# Patient Record
Sex: Female | Born: 1955 | Race: Asian | Hispanic: No | Marital: Married | Smoking: Never smoker
Health system: Southern US, Community
[De-identification: ages and names within clinical notes are randomized; demographics above are authoritative.]

## PROBLEM LIST (undated history)

## (undated) DIAGNOSIS — Z9889 Other specified postprocedural states: Secondary | ICD-10-CM

## (undated) DIAGNOSIS — R112 Nausea with vomiting, unspecified: Secondary | ICD-10-CM

## (undated) HISTORY — PX: BACK SURGERY: SHX140

---

## 2005-03-05 ENCOUNTER — Encounter: Admission: RE | Admit: 2005-03-05 | Discharge: 2005-03-05 | Payer: Self-pay | Admitting: Family Medicine

## 2005-04-08 ENCOUNTER — Ambulatory Visit (HOSPITAL_COMMUNITY): Admission: RE | Admit: 2005-04-08 | Discharge: 2005-04-09 | Payer: Self-pay | Admitting: Neurosurgery

## 2005-06-14 ENCOUNTER — Encounter: Admission: RE | Admit: 2005-06-14 | Discharge: 2005-06-14 | Payer: Self-pay | Admitting: Neurosurgery

## 2005-12-17 ENCOUNTER — Encounter (INDEPENDENT_AMBULATORY_CARE_PROVIDER_SITE_OTHER): Payer: Self-pay | Admitting: Specialist

## 2005-12-17 ENCOUNTER — Ambulatory Visit (HOSPITAL_COMMUNITY): Admission: RE | Admit: 2005-12-17 | Discharge: 2005-12-17 | Payer: Self-pay | Admitting: Obstetrics and Gynecology

## 2010-04-14 ENCOUNTER — Encounter
Admission: RE | Admit: 2010-04-14 | Discharge: 2010-04-14 | Payer: Self-pay | Source: Home / Self Care | Attending: Family Medicine | Admitting: Family Medicine

## 2010-09-11 NOTE — Op Note (Signed)
NAME:  Samantha Maddox, Samantha Maddox                     ACCOUNT NO.:  [id number]   MEDICAL RECORD NO.:  [id number]          PATIENT TYPE:  INP   LOCATION:  2899                         FACILITY:  [facility]   PHYSICIAN:  Reinaldo Meeker, M.D. DATE OF BIRTH:  1956/03/20   DATE OF PROCEDURE:  04/08/2005  DATE OF DISCHARGE:                                 OPERATIVE REPORT   PREOPERATIVE DIAGNOSIS:  Spondylolisthesis with stenosis, L5-S1.   POSTOPERATIVE DIAGNOSIS:  Spondylolisthesis with stenosis, L5-S1.   PROCEDURE:  Bilateral L5-S1 decompressive laminectomy followed by bilateral  microdiskectomy followed by posterior lumbar interbody fusion followed by  transfacet screw fixation.   SECONDARY PROCEDURE:  Microdissection, L5-S1 nerve roots and L5-S1 disks and  intraoperative EMG monitoring.   SURGEON:  Reinaldo Meeker, M.D.   ASSISTANT:  Marikay Alar, M.D.   DESCRIPTION OF PROCEDURE:  After being placed in the prone position, the  patient's back was prepped and draped in the usual sterile fashion.  A  localizing x-ray was taken prior to incision, and we identified the  appropriate level.   A midline incision was made above the spinous processes of L5 and S1.  Using  __________ cutting current, the incision was carried down to the spinous  processes.  Subperiosteal dissection was then carried out bilaterally on the  spinous processes, lamina, and facet joint.  A self-retaining retractor was  placed for exposure.  X-rays showed approach at the appropriate level.  Using the Stille rongeur, the spinous process and intraspinous ligament were  removed.  Starting on the patient's left side, laminotomy was performed by  removing the inferior one-half of the L5 lamina, the medial one-third of the  facet joint, and the superior one-half of the S1 lamina.  Residual bone and  ligamentum flavum were removed in a piecemeal fashion.  Similar  decompression was carried out on the patient's right side, once again  removing the medial one-third of the facet joint, the inferior one-half of  the L5 lamina, and the superior one-half of the S1 lamina.  Once again,  residual bone was removed to be used later in the case.  The ligamentum  flavum was removed in a piecemeal fashion.  Residual midline structures were  then removed to complete the bilateral decompressive laminectomy.  The S1  nerve roots were followed out to the foramen bilaterally.  On the patient's  left, symptomatic side, dissection was carried up to identify the L5 nerve  root, and more bone was needed was removed than necessary for simple  interbody fusion.  At this time, bilateral microdiskectomy was carried out.  Microdissection technique was used to identify the lateral aspect of the  thecal sac and S1 nerve root.  Coagulation was carried out on the floor of  the canal to identify the disk.  Disk annulus was coagulated and incised and  disk thoroughly cleaned out with pituitary rongeurs and curettes.  Similar  microdiskectomy was carried out on the patient's opposite side.   The interspace was then prepared for posterior lumbar interbody fusion.  A  10-mm distractor  was placed and found to be a good fit.  On the opposite  side, scraping followed by box cutting was carried out.  At this time, a 10  x 9 x 20-mm plug was then packed into good position and confirmed under  fluoroscopy.  The distractor was then removed.  A similar procedure was  carried out on the opposite side.  Prior to placing the second bony spacer  and autologous bone graft, __________ were placed in the midline to help  with interbody fusion.  At this time, both spacers were found to be in good  position.   Transfacet screw fixation was then carried out.  Starting on the patient's  left side, a drill over a guidewire was passed down through the inferior  facet of L5 and entered the pedicle of S1.  This was followed under AP and  lateral fluoroscopy ____________ of  the medial pedicle.  Intraoperative EMG  monitoring also showed no evidence of breach of the pedicle.  A 30-mm screw  was then placed after tapping, and the screws were noted to be in good  position.  A similar procedure was then carried out on the patient's right  side.  Once again, a drill over a guidewire was passed down through the  inferior facet of L5 and into the pedicle of S1.  Once again, intact pedicle  was confirmed with direct palpation in the AP and lateral fluoroscopy and  intraoperative EMG monitoring, all of which showed an intact medial pedicle.  Once again, tapping was carried out followed by placing a 30-mm transfacet  screw.  Final fluoroscopy at this time showed excellent placement of the  screws in the AP and lateral direction as well as the bony spacers.  Large  amounts of irrigation were carried out at this time.  Any bleeding was  controlled with bipolar coagulation and Gelfoam.  The wound was then closed  in multiple layers with Vicryl in the muscle fascia, subcutaneous tissues,  and staples on the skin.  A sterile dressing was then applied.   The patient was extubated and taken to the recovery room in stable  condition.           ______________________________  Reinaldo Meeker, M.D.     ROK/MEDQ  D:  04/08/2005  T:  04/11/2005  Job:  [id number]

## 2010-09-11 NOTE — H&P (Signed)
NAME:  Samantha Maddox, Samantha Maddox                     ACCOUNT NO.:  [id number]   MEDICAL RECORD NO.:  [id number]          PATIENT TYPE:  AMB   LOCATION:  SDC                           FACILITY:  [facility]   PHYSICIAN:  Juluis Mire, M.D.   DATE OF BIRTH:  Aug 26, 1955   DATE OF ADMISSION:  12/17/2005  DATE OF DISCHARGE:                                HISTORY & PHYSICAL   Patient is a 55 year old gravida 1, para 1 [place] female who presents for  laparoscopic evaluation and probable removal of the right tube and ovary.  In relation to present admission, patient was seen for a routine physical on  October 13, 2005.  She had no specific gynecological complaints at that time.  She noted, on exam, a right-sided fullness.  Subsequent ultrasound revealed  a 4.7 cm complex mass of the right ovary with cystic and solid areas that  look like a dermoid.  She did have some free fluid but minimal in nature.  Subsequent CA125 was normal.  Follow-up ultrasound revealed its persistence.  In view of these findings, we are going to proceed with laparoscopy and  attempted removal of right tube and ovary should this indeed appear to be a  benign process.  Obviously if it is borderline or malignant, we may forego  laparoscopic evaluation, reschedule surgery with the aid of a gynecologic  oncologist.  This has been discussed with the patient through an  interpreter.   ALLERGIES:  NO KNOWN DRUG ALLERGIES.   MEDICATIONS:  None.   PAST SURGICAL HISTORY:  1. Back surgery.  2. She has had one vaginal delivery.   FAMILY HISTORY:  Noncontributory.   SOCIAL HISTORY:  Noncontributory.   REVIEW OF SYSTEMS:  Noncontributory.   PHYSICAL EXAMINATION:  VITAL SIGNS:  The patient is afebrile with stable  vital signs.  HEENT:  Patient is normocephalic.  Pupils are equal, round, reactive to  light and accommodation.  Extraocular movements were intact.  Sclerae and  conjunctivae are clear.  Oropharynx clear.  NECK:  Without thyromegaly.  BREASTS:  No discrete masses.  LUNGS:  Clear.  CARDIOVASCULAR:  Regular rate and rhythm.  There are no murmurs or gallops.  ABDOMEN:  Benign, no masses, organomegaly or tenderness.  PELVIC:  Normal external genitalia.  Vaginal vault is clear.  Cervical  unremarkable.  Uterus feels to be normal size and shape.  Definite right-  sided fullness consistent with known finding on ultrasound.  EXTREMITIES:  Trace edema.  NEUROLOGIC:  Grossly within normal limits.   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   PLAN:  The patient will undergo a laparoscopic evaluation and attempt at  resection of the right tube and ovary.  The risks have been discussed  including the risk of infection, the risk of hemorrhage that could require  transfusion, the risk of AIDS or hepatitis, the risk of injury to adjacent  organs including bowel, bladder or ureters that could require further  exploratory surgery, the risk of deep venous thrombosis and pulmonary  embolus.  She does understand if this  turns out to be malignant or  borderline tumor, we may not approach it laparoscopically but reschedule  further staging surgery.  Obviously if it is removed and turns to be a  borderline tumor, further staging procedures may need to be undertaken.  Potentially, if it is a malignancy, remove it laparoscopically have  spillage, we can worsen the stage of the disease.      Juluis Mire, M.D.  Electronically Signed     JSM/MEDQ  D:  12/17/2005  T:  12/17/2005  Job:  [id number]

## 2010-09-11 NOTE — Op Note (Signed)
NAME:  Samantha Maddox, Samantha Maddox                     ACCOUNT NO.:  [id number]   MEDICAL RECORD NO.:  [id number]          PATIENT TYPE:  AMB   LOCATION:  SDC                           [facility]ACILITY:  [facility]   PHYSICIAN:  Juluis Mire, M.D.   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   OPERATION/PROCEDURE:  Open laparoscopy, right salpingo-oophorectomy.   SURGEON:  Juluis Mire, M.D.   ANESTHESIA:  General.   ESTIMATED BLOOD LOSS:  Minimal.   PACKS/DRAINS:  None.   INTRAOPERATIVE BLOOD PLACED:  None.   COMPLICATIONS:  None.   INDICATIONS:  Dictated in the history and physical.   PROCEDURE:  Patient was taken to the OR and placed in a supine position.  After a satisfactory level of general endotracheal anesthesia was obtained,  the patient was placed in the dorsal lithotomy position using the Allen's  stirrups.  The abdomen, perineum, and vagina were prepped with Betadine and  draped in a sterile field.  The bladder was emptied by in and out  catheterization.  The Hulka tenaculum was put in place and secured.  A  subumbilical incision was made with a knife and carried to the subcutaneous  tissue.  The fascia was entered sharply, and the incision __________.  The  peritoneum was entered bluntly.  A taut laparoscopic trocar was inserted and  secured.  The laparoscope was introduced.  There was no evidence of injury  to adjacent organs.  A 5 mm trocar was put in place in the suprapubic area.  Visualization revealed a normal uterus.  The left tube and ovary was normal.  The right ovary was enlarged to approximately 5 cm.  There were no  excrescences.  No signs of ascites or any signs of malignant changes.  We  did obtain pelvic washings.  The appendix was retrocecal and normal.   Upper  abdomen, including liver and tip of the gallbladder, were clear.  Omentum  was clear.  There was no evidence of any intra-abdominal pathology.  Next,  we placed a second 5 mm trocar in the left lower quadrant after visualizing  the epigastric vessels.  The ovary was elevated.  The ureter was easily seen  along the pelvic side wall.  Using the gyrus bipolar, we cauterized and  sized the ovarian vasculature.  We cauterized and sized the mesenteric  attachment of the ovary and tube.  We then cauterized and sized the utero-  ovarian pedicle.  We had good hemostasis.  We then brought in a 5 mm  laparoscope, put it down in the left lower quadrant 5 mm trocar.  We brought  the endo bag through the subumbilical port. We were able to secure the ovary  in the endo bag and eventually work it out through the subumbilical  incision.  It was sent for pathology.  At this  time, we reintroduced the 10  mm laparoscope, visualized the pelvic cavity.  We had excellent hemostasis.  We thoroughly irrigated the pelvis.  No active bleeding or signs of injury  to adjacent organs.  At this point in time, all trocars were removed.  The  abdomen had been deflated with carbon dioxide.  The subumbilical fascia was  closed with figure-of-eight 0 Vicryl and skin with interrupted sub-  reticulars or 4-0 Vicryl.  The suprapubic incision was closed with  Dermabond.  The Hulka tenaculum was then removed.  The patient was taken out  of the dorsal supine position, once alert and extubated, transferred to the  recovery room in good condition.  Sponge, needle, and instrument count was  reported as corrected by the circulating nurse x2.      Juluis Mire, M.D.  Electronically Signed     JSM/MEDQ  D:  12/17/2005  T:  12/17/2005  Job:  [id number]

## 2011-01-18 ENCOUNTER — Ambulatory Visit: Payer: Self-pay | Admitting: *Deleted

## 2013-10-10 ENCOUNTER — Other Ambulatory Visit: Payer: Self-pay | Admitting: Physical Medicine and Rehabilitation

## 2013-10-10 DIAGNOSIS — M545 Low back pain, unspecified: Secondary | ICD-10-CM

## 2013-10-22 ENCOUNTER — Ambulatory Visit
Admission: RE | Admit: 2013-10-22 | Discharge: 2013-10-22 | Disposition: A | Discharge status: Home / Self Care | Payer: 59 | Source: Ambulatory Visit | Attending: Physical Medicine and Rehabilitation | Admitting: Physical Medicine and Rehabilitation

## 2013-10-22 DIAGNOSIS — M545 Low back pain, unspecified: Secondary | ICD-10-CM

## 2013-10-22 MED ORDER — IOHEXOL 180 MG/ML  SOLN
1.0000 mL | Freq: Once | INTRAMUSCULAR | Status: AC | PRN
  Administered 2013-10-22: 1 mL via EPIDURAL

## 2013-10-22 MED ORDER — METHYLPREDNISOLONE ACETATE 40 MG/ML INJ SUSP (RADIOLOG
120.0000 mg | Freq: Once | INTRAMUSCULAR | Status: AC
Start: 2013-10-22 — End: 2013-10-22
  Administered 2013-10-22: 120 mg via EPIDURAL

## 2013-10-22 NOTE — Discharge Instructions (Signed)

## 2014-08-14 ENCOUNTER — Other Ambulatory Visit: Payer: Self-pay | Admitting: Orthopedic Surgery

## 2014-08-14 ENCOUNTER — Ambulatory Visit
Admission: RE | Admit: 2014-08-14 | Discharge: 2014-08-14 | Disposition: A | Discharge status: Home / Self Care | Payer: 59 | Source: Ambulatory Visit | Attending: Family Medicine | Admitting: Family Medicine

## 2014-08-14 ENCOUNTER — Other Ambulatory Visit: Payer: Self-pay | Admitting: Family Medicine

## 2014-08-14 DIAGNOSIS — M545 Low back pain: Secondary | ICD-10-CM

## 2014-08-14 DIAGNOSIS — R0789 Other chest pain: Secondary | ICD-10-CM

## 2014-08-16 ENCOUNTER — Ambulatory Visit
Admission: RE | Admit: 2014-08-16 | Discharge: 2014-08-16 | Disposition: A | Discharge status: Home / Self Care | Payer: 59 | Source: Ambulatory Visit | Attending: Orthopedic Surgery | Admitting: Orthopedic Surgery

## 2014-08-16 DIAGNOSIS — M545 Low back pain: Secondary | ICD-10-CM

## 2014-08-22 NOTE — Pre-Procedure Instructions (Signed)
Tami RibasLien Thi Ferrer  08/22/2014   Your procedure is scheduled on:  May 5th, Tuesday   [facility] at 5:30 AM.   Call this number if you have problems the morning of surgery: [phone]   Remember:   Jane not eat food or drink liquids after midnight Monday.   Take these medicines the morning of surgery with A SIP OF WATER: NOTHING   Smiddy not wear jewelry, make-up or nail polish.  Schopf not wear lotions, powders, or perfumes. You may NOT wear deodorant the day of surgery.  Kehoe not shave underarms & legs 48 hours prior to surgery.    Criger not bring valuables to the hospital.  [facility] not [facility] any belongings or valuables.               Contacts, dentures or bridgework may not be worn into surgery.  Leave suitcase in the car. After surgery it may be brought to your room.  For patients admitted to the hospital, discharge time is determined by your treatment team.    Name and phone number of your driver:    Special Instructions: "Preparing for Surgery" instruction sheet.   Please read over the following fact sheets that you were given: Pain Booklet, MRSA Information and Surgical Site Infection Prevention

## 2014-08-23 ENCOUNTER — Ambulatory Visit (HOSPITAL_COMMUNITY)
Admission: RE | Admit: 2014-08-23 | Discharge: 2014-08-23 | Disposition: A | Discharge status: Home / Self Care | Payer: 59 | Source: Ambulatory Visit | Attending: Orthopedic Surgery | Admitting: Orthopedic Surgery

## 2014-08-23 ENCOUNTER — Encounter (HOSPITAL_COMMUNITY)
Admission: RE | Admit: 2014-08-23 | Discharge: 2014-08-23 | Disposition: A | Discharge status: Home / Self Care | Payer: 59 | Source: Ambulatory Visit | Attending: Orthopedic Surgery | Admitting: Orthopedic Surgery

## 2014-08-23 ENCOUNTER — Encounter (HOSPITAL_COMMUNITY): Payer: Self-pay

## 2014-08-23 DIAGNOSIS — Z01818 Encounter for other preprocedural examination: Secondary | ICD-10-CM

## 2014-08-23 LAB — URINE MICROSCOPIC-ADD ON

## 2014-08-23 LAB — URINALYSIS, ROUTINE W REFLEX MICROSCOPIC
Bilirubin Urine: NEGATIVE
GLUCOSE, UA: NEGATIVE mg/dL
Ketones, ur: NEGATIVE mg/dL
LEUKOCYTES UA: NEGATIVE
NITRITE: NEGATIVE
Protein, ur: NEGATIVE mg/dL
Specific Gravity, Urine: 1.011 (ref 1.005–1.030)
UROBILINOGEN UA: 0.2 mg/dL (ref 0.0–1.0)
pH: 6 (ref 5.0–8.0)

## 2014-08-23 LAB — COMPREHENSIVE METABOLIC PANEL
ALK PHOS: 62 U/L (ref 39–117)
ALT: 63 U/L — AB (ref 0–35)
AST: 46 U/L — ABNORMAL HIGH (ref 0–37)
Albumin: 4.2 g/dL (ref 3.5–5.2)
Anion gap: 7 (ref 5–15)
BUN: 14 mg/dL (ref 6–23)
CHLORIDE: 101 mmol/L (ref 96–112)
CO2: 29 mmol/L (ref 19–32)
Calcium: 9.4 mg/dL (ref 8.4–10.5)
Creatinine, Ser: 0.94 mg/dL (ref 0.50–1.10)
GFR calc Af Amer: 76 mL/min — ABNORMAL LOW (ref >90)
GFR calc non Af Amer: 66 mL/min — ABNORMAL LOW (ref >90)
GLUCOSE: 110 mg/dL — AB (ref 70–99)
Potassium: 3.7 mmol/L (ref 3.5–5.1)
SODIUM: 137 mmol/L (ref 135–145)
TOTAL PROTEIN: 7.2 g/dL (ref 6.0–8.3)
Total Bilirubin: 0.6 mg/dL (ref 0.3–1.2)

## 2014-08-23 LAB — PROTIME-INR
INR: 1.02 (ref 0.00–1.49)
Prothrombin Time: 13.5 seconds (ref 11.6–15.2)

## 2014-08-23 LAB — APTT: APTT: 33 s (ref 24–37)

## 2014-08-23 LAB — SURGICAL PCR SCREEN
MRSA, PCR: NEGATIVE
STAPHYLOCOCCUS AUREUS: NEGATIVE

## 2014-08-23 LAB — CBC WITH DIFFERENTIAL/PLATELET
BASOS ABS: 0.1 10*3/uL (ref 0.0–0.1)
Basophils Relative: 1 % (ref 0–1)
Eosinophils Absolute: 0.1 10*3/uL (ref 0.0–0.7)
Eosinophils Relative: 2 % (ref 0–5)
HEMATOCRIT: 37.6 % (ref 36.0–46.0)
Hemoglobin: 12.7 g/dL (ref 12.0–15.0)
LYMPHS PCT: 47 % — AB (ref 12–46)
Lymphs Abs: 2.7 10*3/uL (ref 0.7–4.0)
MCH: 29.7 pg (ref 26.0–34.0)
MCHC: 33.8 g/dL (ref 30.0–36.0)
MCV: 87.9 fL (ref 78.0–100.0)
Monocytes Absolute: 0.2 10*3/uL (ref 0.1–1.0)
Monocytes Relative: 4 % (ref 3–12)
NEUTROS PCT: 46 % (ref 43–77)
Neutro Abs: 2.6 10*3/uL (ref 1.7–7.7)
Platelets: 264 10*3/uL (ref 150–400)
RBC: 4.28 MIL/uL (ref 3.87–5.11)
RDW: 13.6 % (ref 11.5–15.5)
WBC: 5.7 10*3/uL (ref 4.0–10.5)

## 2014-08-23 LAB — TYPE AND SCREEN
ABO/RH(D): O POS
Antibody Screen: NEGATIVE

## 2014-08-23 NOTE — Progress Notes (Signed)
Patient and spouse came in with interpreter--Thai Dory PeruChung [phone]  (He will be here also on the DOS at 0530) The spouse can speak english pretty well, and "Dierdre SearlesLi" can understand and speak english, but you have to go slow.

## 2014-08-28 MED ORDER — POVIDONE-IODINE 7.5 % EX SOLN
CUTANEOUS | Status: DC
  Filled 2014-08-28: qty 118

## 2014-08-28 MED ORDER — CEFAZOLIN SODIUM-DEXTROSE 2-3 GM-% IV SOLR
2.0000 g | INTRAVENOUS | Status: AC
  Administered 2014-08-29: 2 g via INTRAVENOUS
  Filled 2014-08-28: qty 50

## 2014-08-28 NOTE — H&P (Signed)
     PREOPERATIVE H&P  Chief Complaint: bilateral leg pain  HPI: Samantha Maddox is a 59 y.o. female who presents with ongoing pain in the bilateral legs  MRI reveals stenosis at L3/4, above the patient's previous L4/5 fusion  Patient has failed multiple forms of conservative care and continues to have pain (see office notes for additional details regarding the patient's full course of treatment)  No past medical history on file. Past Surgical History  Procedure Laterality Date  . Back surgery      03/2005   History   Social History  . Marital Status: Married    Spouse Name: N/A  . Number of Children: N/A  . Years of Education: N/A   Social History Main Topics  . Smoking status: Never Smoker   . Smokeless tobacco: Not on file  . Alcohol Use: No  . Drug Use: No  . Sexual Activity: Not on file   Other Topics Concern  . Not on file   Social History Narrative  . No narrative on file   No family history on file. No Known Allergies Prior to Admission medications   Medication Sig Start Date End Date Taking? Authorizing Provider  traZODone (DESYREL) 50 MG tablet Take 50 mg by mouth at bedtime.   Yes Historical Provider, MD  naproxen sodium (ANAPROX) 220 MG tablet Take 220 mg by mouth 2 (two) times daily with a meal.    Historical Provider, MD     All other systems have been reviewed and were otherwise negative with the exception of those mentioned in the HPI and as above.  Physical Exam: There were no vitals filed for this visit.  General: Alert, no acute distress Cardiovascular: No pedal edema Respiratory: No cyanosis, no use of accessory musculature Skin: No lesions in the area of chief complaint Neurologic: Sensation intact distally Psychiatric: Patient is competent for consent with normal mood and affect Lymphatic: No axillary or cervical lymphadenopathy   Assessment/Plan: Bilateral leg pain Plan for Procedure(s): LATERAL LUMBAR FUSION 1 LEVEL POSTERIOR  LUMBAR FUSION 1 LEVEL   Emilee HeroUMONSKI,Dominica Kent LEONARD, MD 08/28/2014 9:36 AM

## 2014-08-29 ENCOUNTER — Inpatient Hospital Stay (HOSPITAL_COMMUNITY): Payer: 59 | Admitting: Certified Registered Nurse Anesthetist

## 2014-08-29 ENCOUNTER — Inpatient Hospital Stay (HOSPITAL_COMMUNITY)
Admission: RE | Admit: 2014-08-29 | Discharge: 2014-08-31 | DRG: 455 | Disposition: A | Discharge status: Home / Self Care | Payer: 59 | Source: Ambulatory Visit | Attending: Orthopedic Surgery | Admitting: Orthopedic Surgery

## 2014-08-29 ENCOUNTER — Inpatient Hospital Stay (HOSPITAL_COMMUNITY): Payer: 59

## 2014-08-29 ENCOUNTER — Encounter (HOSPITAL_COMMUNITY): Payer: Self-pay | Admitting: *Deleted

## 2014-08-29 ENCOUNTER — Encounter (HOSPITAL_COMMUNITY)
Admission: RE | Disposition: A | Discharge status: Home / Self Care | Payer: 59 | Source: Ambulatory Visit | Attending: Orthopedic Surgery

## 2014-08-29 DIAGNOSIS — M4316 Spondylolisthesis, lumbar region: Secondary | ICD-10-CM | POA: Diagnosis present

## 2014-08-29 DIAGNOSIS — M5410 Radiculopathy, site unspecified: Secondary | ICD-10-CM | POA: Diagnosis present

## 2014-08-29 DIAGNOSIS — Z981 Arthrodesis status: Secondary | ICD-10-CM

## 2014-08-29 DIAGNOSIS — M4806 Spinal stenosis, lumbar region: Secondary | ICD-10-CM | POA: Diagnosis present

## 2014-08-29 DIAGNOSIS — Z419 Encounter for procedure for purposes other than remedying health state, unspecified: Secondary | ICD-10-CM

## 2014-08-29 DIAGNOSIS — M79606 Pain in leg, unspecified: Secondary | ICD-10-CM | POA: Diagnosis present

## 2014-08-29 HISTORY — PX: ANTERIOR LAT LUMBAR FUSION: SHX1168

## 2014-08-29 HISTORY — DX: Other specified postprocedural states: Z98.890

## 2014-08-29 HISTORY — DX: Nausea with vomiting, unspecified: R11.2

## 2014-08-29 SURGERY — ANTERIOR LATERAL LUMBAR FUSION 1 LEVEL
Anesthesia: General

## 2014-08-29 MED ORDER — SODIUM CHLORIDE 0.9 % IV SOLN
INTRAVENOUS | Status: DC
  Administered 2014-08-29: 17:00:00 via INTRAVENOUS

## 2014-08-29 MED ORDER — THROMBIN 20000 UNITS EX KIT: PACK | CUTANEOUS | Status: DC | PRN

## 2014-08-29 MED ORDER — LACTATED RINGERS IV SOLN
INTRAVENOUS | Status: DC | PRN
  Administered 2014-08-29 (×4): via INTRAVENOUS

## 2014-08-29 MED ORDER — DOCUSATE SODIUM 100 MG PO CAPS
100.0000 mg | ORAL_CAPSULE | Freq: Two times a day (BID) | ORAL | Status: DC
  Administered 2014-08-30 (×2): 100 mg via ORAL
  Filled 2014-08-29 (×2): qty 1

## 2014-08-29 MED ORDER — PROPOFOL INFUSION 10 MG/ML OPTIME
INTRAVENOUS | Status: DC | PRN
  Administered 2014-08-29: 75 ug/kg/min via INTRAVENOUS
  Administered 2014-08-29 (×2): 50 ug/kg/min via INTRAVENOUS

## 2014-08-29 MED ORDER — PHENOL 1.4 % MT LIQD: 1.0000 | OROMUCOSAL | Status: DC | PRN

## 2014-08-29 MED ORDER — SODIUM CHLORIDE 0.9 % IV SOLN
0.5000 mg | INTRAVENOUS | Status: DC | PRN
  Administered 2014-08-29: .5 mg via INTRAVENOUS

## 2014-08-29 MED ORDER — SODIUM CHLORIDE 0.9 % IJ SOLN
3.0000 mL | Freq: Two times a day (BID) | INTRAMUSCULAR | Status: DC
  Administered 2014-08-29 – 2014-08-30 (×3): 3 mL via INTRAVENOUS

## 2014-08-29 MED ORDER — EPHEDRINE SULFATE 50 MG/ML IJ SOLN
INTRAMUSCULAR | Status: AC
  Filled 2014-08-29: qty 1

## 2014-08-29 MED ORDER — MENTHOL 3 MG MT LOZG: 1.0000 | LOZENGE | OROMUCOSAL | Status: DC | PRN

## 2014-08-29 MED ORDER — 0.9 % SODIUM CHLORIDE (POUR BTL) OPTIME
TOPICAL | Status: DC | PRN
  Administered 2014-08-29: 1000 mL

## 2014-08-29 MED ORDER — SODIUM CHLORIDE 0.9 % IJ SOLN: 3.0000 mL | INTRAMUSCULAR | Status: DC | PRN

## 2014-08-29 MED ORDER — ONDANSETRON HCL 4 MG/2ML IJ SOLN
4.0000 mg | INTRAMUSCULAR | Status: DC | PRN
  Administered 2014-08-29 – 2014-08-30 (×2): 4 mg via INTRAVENOUS
  Filled 2014-08-29 (×2): qty 2

## 2014-08-29 MED ORDER — ARTIFICIAL TEARS OP OINT
TOPICAL_OINTMENT | OPHTHALMIC | Status: AC
  Filled 2014-08-29: qty 3.5

## 2014-08-29 MED ORDER — DIAZEPAM 5 MG PO TABS
5.0000 mg | ORAL_TABLET | Freq: Four times a day (QID) | ORAL | Status: DC | PRN
  Administered 2014-08-30: 5 mg via ORAL
  Filled 2014-08-29: qty 1

## 2014-08-29 MED ORDER — SENNOSIDES-DOCUSATE SODIUM 8.6-50 MG PO TABS
1.0000 | ORAL_TABLET | Freq: Every evening | ORAL | Status: DC | PRN
  Filled 2014-08-29: qty 1

## 2014-08-29 MED ORDER — METHYLENE BLUE 1 % INJ SOLN
INTRAMUSCULAR | Status: AC
  Filled 2014-08-29: qty 10

## 2014-08-29 MED ORDER — MIDAZOLAM HCL 5 MG/5ML IJ SOLN
INTRAMUSCULAR | Status: DC | PRN
  Administered 2014-08-29: 2 mg via INTRAVENOUS

## 2014-08-29 MED ORDER — LIDOCAINE HCL (CARDIAC) 20 MG/ML IV SOLN
INTRAVENOUS | Status: AC
  Filled 2014-08-29: qty 5

## 2014-08-29 MED ORDER — ACETAMINOPHEN 650 MG RE SUPP: 650.0000 mg | RECTAL | Status: DC | PRN

## 2014-08-29 MED ORDER — FENTANYL CITRATE (PF) 250 MCG/5ML IJ SOLN
INTRAMUSCULAR | Status: AC
  Filled 2014-08-29: qty 5

## 2014-08-29 MED ORDER — ONDANSETRON HCL 4 MG/2ML IJ SOLN
INTRAMUSCULAR | Status: DC | PRN
  Administered 2014-08-29: 4 mg via INTRAVENOUS

## 2014-08-29 MED ORDER — ZOLPIDEM TARTRATE 5 MG PO TABS: 5.0000 mg | ORAL_TABLET | Freq: Every evening | ORAL | Status: DC | PRN

## 2014-08-29 MED ORDER — HYDROMORPHONE HCL 1 MG/ML IJ SOLN
INTRAMUSCULAR | Status: AC
  Filled 2014-08-29: qty 1

## 2014-08-29 MED ORDER — BUPIVACAINE-EPINEPHRINE 0.25% -1:200000 IJ SOLN
INTRAMUSCULAR | Status: DC | PRN
  Administered 2014-08-29: 14 mL

## 2014-08-29 MED ORDER — SODIUM CHLORIDE 0.9 % IV SOLN: 250.0000 mL | INTRAVENOUS | Status: DC

## 2014-08-29 MED ORDER — PROPOFOL 10 MG/ML IV BOLUS
INTRAVENOUS | Status: DC | PRN
  Administered 2014-08-29: 30 mg via INTRAVENOUS
  Administered 2014-08-29: 120 mg via INTRAVENOUS
  Administered 2014-08-29: 50 mg via INTRAVENOUS

## 2014-08-29 MED ORDER — HYDROMORPHONE HCL 1 MG/ML IJ SOLN
0.2500 mg | INTRAMUSCULAR | Status: DC | PRN
  Administered 2014-08-29 (×3): 0.25 mg via INTRAVENOUS

## 2014-08-29 MED ORDER — FLEET ENEMA 7-19 GM/118ML RE ENEM: 1.0000 | ENEMA | Freq: Once | RECTAL | Status: AC | PRN

## 2014-08-29 MED ORDER — BISACODYL 5 MG PO TBEC
5.0000 mg | DELAYED_RELEASE_TABLET | Freq: Every day | ORAL | Status: DC | PRN
  Filled 2014-08-29: qty 1

## 2014-08-29 MED ORDER — PROMETHAZINE HCL 25 MG/ML IJ SOLN: 6.2500 mg | INTRAMUSCULAR | Status: DC | PRN

## 2014-08-29 MED ORDER — BUPIVACAINE-EPINEPHRINE (PF) 0.25% -1:200000 IJ SOLN
INTRAMUSCULAR | Status: AC
  Filled 2014-08-29: qty 30

## 2014-08-29 MED ORDER — FENTANYL CITRATE (PF) 100 MCG/2ML IJ SOLN
INTRAMUSCULAR | Status: DC | PRN
  Administered 2014-08-29 (×4): 50 ug via INTRAVENOUS
  Administered 2014-08-29: 100 ug via INTRAVENOUS
  Administered 2014-08-29: 50 ug via INTRAVENOUS

## 2014-08-29 MED ORDER — ACETAMINOPHEN 325 MG PO TABS: 650.0000 mg | ORAL_TABLET | ORAL | Status: DC | PRN

## 2014-08-29 MED ORDER — MIDAZOLAM HCL 2 MG/2ML IJ SOLN
INTRAMUSCULAR | Status: AC
  Filled 2014-08-29: qty 2

## 2014-08-29 MED ORDER — PROPOFOL 10 MG/ML IV BOLUS
INTRAVENOUS | Status: AC
  Filled 2014-08-29: qty 20

## 2014-08-29 MED ORDER — ARTIFICIAL TEARS OP OINT
TOPICAL_OINTMENT | OPHTHALMIC | Status: DC | PRN
  Administered 2014-08-29: 1 via OPHTHALMIC

## 2014-08-29 MED ORDER — PHENYLEPHRINE HCL 10 MG/ML IJ SOLN
INTRAMUSCULAR | Status: DC | PRN
  Administered 2014-08-29: 40 ug via INTRAVENOUS

## 2014-08-29 MED ORDER — OXYCODONE-ACETAMINOPHEN 5-325 MG PO TABS
1.0000 | ORAL_TABLET | ORAL | Status: DC | PRN
  Administered 2014-08-30 – 2014-08-31 (×3): 1 via ORAL
  Filled 2014-08-29 (×3): qty 1

## 2014-08-29 MED ORDER — MORPHINE SULFATE 2 MG/ML IJ SOLN
1.0000 mg | INTRAMUSCULAR | Status: DC | PRN
  Administered 2014-08-29: 2 mg via INTRAVENOUS
  Filled 2014-08-29: qty 1

## 2014-08-29 MED ORDER — THROMBIN 20000 UNITS EX SOLR
CUTANEOUS | Status: AC
  Filled 2014-08-29: qty 20000

## 2014-08-29 MED ORDER — LIDOCAINE HCL (CARDIAC) 20 MG/ML IV SOLN
INTRAVENOUS | Status: DC | PRN
  Administered 2014-08-29: 60 mg via INTRAVENOUS

## 2014-08-29 MED ORDER — ALUM & MAG HYDROXIDE-SIMETH 200-200-20 MG/5ML PO SUSP
30.0000 mL | Freq: Four times a day (QID) | ORAL | Status: DC | PRN
  Administered 2014-08-29: 30 mL via ORAL
  Filled 2014-08-29: qty 30

## 2014-08-29 MED ORDER — SODIUM CHLORIDE 0.9 % IJ SOLN
INTRAMUSCULAR | Status: AC
  Filled 2014-08-29: qty 10

## 2014-08-29 MED ORDER — SUCCINYLCHOLINE CHLORIDE 20 MG/ML IJ SOLN
INTRAMUSCULAR | Status: AC
  Filled 2014-08-29: qty 1

## 2014-08-29 MED ORDER — SUCCINYLCHOLINE CHLORIDE 20 MG/ML IJ SOLN
INTRAMUSCULAR | Status: DC | PRN
  Administered 2014-08-29: 100 mg via INTRAVENOUS

## 2014-08-29 MED ORDER — THROMBIN 20000 UNITS EX KIT
PACK | CUTANEOUS | Status: DC | PRN
  Administered 2014-08-29: 20 mL via TOPICAL

## 2014-08-29 MED ORDER — CEFAZOLIN SODIUM 1-5 GM-% IV SOLN
1.0000 g | Freq: Three times a day (TID) | INTRAVENOUS | Status: AC
  Administered 2014-08-29 (×2): 1 g via INTRAVENOUS
  Filled 2014-08-29 (×2): qty 50

## 2014-08-29 MED ORDER — ROCURONIUM BROMIDE 50 MG/5ML IV SOLN
INTRAVENOUS | Status: AC
  Filled 2014-08-29: qty 1

## 2014-08-29 MED ORDER — TRAZODONE HCL 50 MG PO TABS
50.0000 mg | ORAL_TABLET | Freq: Every day | ORAL | Status: DC
  Administered 2014-08-30: 50 mg via ORAL
  Filled 2014-08-29 (×3): qty 1

## 2014-08-29 MED FILL — Heparin Sodium (Porcine) Inj 1000 Unit/ML: INTRAMUSCULAR | Qty: 30 | Status: AC

## 2014-08-29 MED FILL — Sodium Chloride IV Soln 0.9%: INTRAVENOUS | Qty: 1000 | Status: AC

## 2014-08-29 SURGICAL SUPPLY — 97 items
BENZOIN TINCTURE PRP APPL 2/3 (GAUZE/BANDAGES/DRESSINGS) ×4 IMPLANT
BLADE SURG 10 STRL SS (BLADE) ×4 IMPLANT
BLADE SURG ROTATE 9660 (MISCELLANEOUS) IMPLANT
BUR PRESCISION 1.7 ELITE (BURR) IMPLANT
BUR ROUND PRECISION 4.0 (BURR) ×3 IMPLANT
BUR ROUND PRECISION 4.0MM (BURR) ×1
CAGE COROENT SX 14X18X45 (Cage) ×3 IMPLANT
CAGE COROENT SX 14X18X45MM (Cage) ×1 IMPLANT
CARTRIDGE OIL MAESTRO DRILL (MISCELLANEOUS) IMPLANT
CLOSURE STERI-STRIP 1/2X4 (GAUZE/BANDAGES/DRESSINGS) ×1
CLOSURE WOUND 1/2 X4 (GAUZE/BANDAGES/DRESSINGS) ×1
CLSR STERI-STRIP ANTIMIC 1/2X4 (GAUZE/BANDAGES/DRESSINGS) ×3 IMPLANT
CONT SPEC STER OR (MISCELLANEOUS) IMPLANT
COVER BACK TABLE 80X110 HD (DRAPES) IMPLANT
COVER MAYO STAND STRL (DRAPES) ×8 IMPLANT
COVER SURGICAL LIGHT HANDLE (MISCELLANEOUS) ×4 IMPLANT
DIFFUSER DRILL AIR PNEUMATIC (MISCELLANEOUS) IMPLANT
DRAIN CHANNEL 15F RND FF W/TCR (WOUND CARE) IMPLANT
DRAPE C-ARM 42X72 X-RAY (DRAPES) ×4 IMPLANT
DRAPE C-ARMOR (DRAPES) ×4 IMPLANT
DRAPE POUCH INSTRU U-SHP 10X18 (DRAPES) ×4 IMPLANT
DRAPE SURG 17X23 STRL (DRAPES) ×32 IMPLANT
DURAPREP 26ML APPLICATOR (WOUND CARE) ×4 IMPLANT
ELECT BLADE 4.0 EZ CLEAN MEGAD (MISCELLANEOUS) ×4
ELECT BLADE 6.5 EXT (BLADE) ×4 IMPLANT
ELECT CAUTERY BLADE 6.4 (BLADE) ×4 IMPLANT
ELECT REM PT RETURN 9FT ADLT (ELECTROSURGICAL) ×4
ELECTRODE BLDE 4.0 EZ CLN MEGD (MISCELLANEOUS) ×2 IMPLANT
ELECTRODE REM PT RTRN 9FT ADLT (ELECTROSURGICAL) ×2 IMPLANT
EVACUATOR SILICONE 100CC (DRAIN) IMPLANT
GAUZE SPONGE 4X4 12PLY STRL (GAUZE/BANDAGES/DRESSINGS) ×4 IMPLANT
GAUZE SPONGE 4X4 16PLY XRAY LF (GAUZE/BANDAGES/DRESSINGS) ×4 IMPLANT
GLOVE BIO SURGEON STRL SZ7 (GLOVE) ×8 IMPLANT
GLOVE BIO SURGEON STRL SZ8 (GLOVE) ×4 IMPLANT
GLOVE BIOGEL PI IND STRL 7.0 (GLOVE) ×2 IMPLANT
GLOVE BIOGEL PI IND STRL 8 (GLOVE) ×2 IMPLANT
GLOVE BIOGEL PI INDICATOR 7.0 (GLOVE) ×2
GLOVE BIOGEL PI INDICATOR 8 (GLOVE) ×2
GOWN STRL REUS W/ TWL LRG LVL3 (GOWN DISPOSABLE) ×4 IMPLANT
GOWN STRL REUS W/ TWL XL LVL3 (GOWN DISPOSABLE) ×2 IMPLANT
GOWN STRL REUS W/TWL LRG LVL3 (GOWN DISPOSABLE) ×4
GOWN STRL REUS W/TWL XL LVL3 (GOWN DISPOSABLE) ×2
GUIDEWIRE SHARP VIPER II (WIRE) ×12 IMPLANT
IV CATH 14GX2 1/4 (CATHETERS) ×4 IMPLANT
KIT BASIN OR (CUSTOM PROCEDURE TRAY) ×4 IMPLANT
KIT DILATOR XLIF 5 (KITS) ×2 IMPLANT
KIT NEEDLE NVM5 EMG ELECT (KITS) ×2 IMPLANT
KIT NEEDLE NVM5 EMG ELECTRODE (KITS) ×2
KIT POSITION SURG JACKSON T1 (MISCELLANEOUS) IMPLANT
KIT ROOM TURNOVER OR (KITS) ×4 IMPLANT
KIT SURGICAL ACCESS MAXCESS 4 (KITS) ×4 IMPLANT
KIT XLIF (KITS) ×2
MARKER SKIN DUAL TIP RULER LAB (MISCELLANEOUS) ×4 IMPLANT
MIX DBX 10CC 35% BONE (Bone Implant) ×4 IMPLANT
NDL SAFETY ECLIPSE 18X1.5 (NEEDLE) ×2 IMPLANT
NEEDLE 22X1 1/2 (OR ONLY) (NEEDLE) ×4 IMPLANT
NEEDLE BONE MARROW 8GX6 FENEST (NEEDLE) IMPLANT
NEEDLE HYPO 18GX1.5 SHARP (NEEDLE) ×2
NEEDLE HYPO 25GX1X1/2 BEV (NEEDLE) ×4 IMPLANT
NEEDLE JAMSHIDI VIPER (NEEDLE) ×16 IMPLANT
NEEDLE SPNL 18GX3.5 QUINCKE PK (NEEDLE) ×8 IMPLANT
NS IRRIG 1000ML POUR BTL (IV SOLUTION) ×4 IMPLANT
OIL CARTRIDGE MAESTRO DRILL (MISCELLANEOUS)
PACK LAMINECTOMY ORTHO (CUSTOM PROCEDURE TRAY) ×4 IMPLANT
PACK UNIVERSAL I (CUSTOM PROCEDURE TRAY) ×4 IMPLANT
PAD ARMBOARD 7.5X6 YLW CONV (MISCELLANEOUS) ×12 IMPLANT
PATTIES SURGICAL .5 X1 (DISPOSABLE) ×4 IMPLANT
PATTIES SURGICAL .5X1.5 (GAUZE/BANDAGES/DRESSINGS) IMPLANT
PLATE BOLT XLIF DECADE 5.5X35 (Plate) ×8 IMPLANT
PLATE DECADE XLIP 2H SZ12 (Plate) ×4 IMPLANT
ROD VIPER2 5.5X45 PRE LARDOSED (Rod) ×4 IMPLANT
SCREW POLY VIPER2 7X40MM (Screw) ×8 IMPLANT
SCREW SET SINGLE INNER MIS (Screw) ×8 IMPLANT
SPONGE INTESTINAL PEANUT (DISPOSABLE) ×16 IMPLANT
SPONGE LAP 4X18 X RAY DECT (DISPOSABLE) ×8 IMPLANT
SPONGE SURGIFOAM ABS GEL 100 (HEMOSTASIS) ×4 IMPLANT
STAPLER VISISTAT 35W (STAPLE) ×4 IMPLANT
STRIP CLOSURE SKIN 1/2X4 (GAUZE/BANDAGES/DRESSINGS) ×3 IMPLANT
SURGIFLO TRUKIT (HEMOSTASIS) IMPLANT
SUT MNCRL AB 4-0 PS2 18 (SUTURE) ×8 IMPLANT
SUT VIC AB 0 CT1 18XCR BRD 8 (SUTURE) ×2 IMPLANT
SUT VIC AB 0 CT1 8-18 (SUTURE) ×2
SUT VIC AB 1 CT1 18XCR BRD 8 (SUTURE) ×2 IMPLANT
SUT VIC AB 1 CT1 8-18 (SUTURE) ×2
SUT VIC AB 2-0 CT2 18 VCP726D (SUTURE) ×8 IMPLANT
SYR 20CC LL (SYRINGE) ×4 IMPLANT
SYR BULB IRRIGATION 50ML (SYRINGE) ×4 IMPLANT
SYR CONTROL 10ML LL (SYRINGE) ×8 IMPLANT
SYR TB 1ML LUER SLIP (SYRINGE) ×4 IMPLANT
TAP CANN VIPER2 DL 5.0 (TAP) ×4 IMPLANT
TAP CANN VIPER2 DL 6.0 (TAP) ×4 IMPLANT
TOWEL OR 17X24 6PK STRL BLUE (TOWEL DISPOSABLE) ×4 IMPLANT
TOWEL OR 17X26 10 PK STRL BLUE (TOWEL DISPOSABLE) ×4 IMPLANT
TRAY FOLEY CATH 16FR SILVER (SET/KITS/TRAYS/PACK) IMPLANT
TRAY FOLEY CATH 16FRSI W/METER (SET/KITS/TRAYS/PACK) ×4 IMPLANT
WATER STERILE IRR 1000ML POUR (IV SOLUTION) IMPLANT
YANKAUER SUCT BULB TIP NO VENT (SUCTIONS) ×8 IMPLANT

## 2014-08-29 NOTE — Progress Notes (Signed)
1 belongings bag found inside the room.  I have called the volunteers to have the family come and get it.  DA

## 2014-08-29 NOTE — Anesthesia Procedure Notes (Signed)
Procedure Name: Intubation Date/Time: 08/29/2014 7:39 AM Performed by: Roney MansSMITH, Delailah Spieth P Pre-anesthesia Checklist: Patient identified, Timeout performed, Emergency Drugs available, Suction available and Patient being monitored Patient Re-evaluated:Patient Re-evaluated prior to inductionOxygen Delivery Method: Circle system utilized Preoxygenation: Pre-oxygenation with 100% oxygen Intubation Type: IV induction Ventilation: Mask ventilation without difficulty Laryngoscope Size: Mac and 3 Grade View: Grade I Tube type: Oral Tube size: 7.0 mm Number of attempts: 1 Airway Equipment and Method: Stylet Placement Confirmation: ETT inserted through vocal cords under direct vision,  breath sounds checked- equal and bilateral and positive ETCO2 Secured at: 21 cm Tube secured with: Tape Dental Injury: Teeth and Oropharynx as per pre-operative assessment  Comments: [facility] Chaney MallingHodierne [facility].

## 2014-08-29 NOTE — Progress Notes (Signed)
Interpreter Tona SensingHai Chung at bedside.

## 2014-08-29 NOTE — Plan of Care (Signed)
Problem: Consults Goal: Diagnosis - Spinal Surgery Outcome: Completed/Met Date Met:  08/29/14 Thoraco/Lumbar Spine Fusion     

## 2014-08-29 NOTE — Anesthesia Postprocedure Evaluation (Signed)
  Anesthesia Post-op Note  Patient: Samantha Maddox  Procedure(s) Performed: Procedure(s) with comments: ANTERIOR LATERAL LUMBAR FUSION 1 LEVEL (Left) - Left sided lumbar 3-4 lateral interbody fusion with allograft and instrumentation POSTERIOR LUMBAR FUSION 1 LEVEL (N/A) - Lumbar 3-4 Posterior lumbar interbody fusion with instrumentation  Patient Location: PACU  Anesthesia Type:General  Level of Consciousness: awake and sedated  Airway and Oxygen Therapy: Patient Spontanous Breathing  Post-op Pain: mild  Post-op Assessment: Post-op Vital signs reviewed  Post-op Vital Signs: stable  Last Vitals:  Filed Vitals:   08/29/14 1245  BP:   Pulse:   Temp: 36.1 C  Resp:     Complications: No apparent anesthesia complications

## 2014-08-29 NOTE — Anesthesia Preprocedure Evaluation (Addendum)
Anesthesia Evaluation  Patient identified by MRN, date of birth, ID band Patient awake    Reviewed: Allergy & Precautions, NPO status , Patient's Chart, lab work & pertinent test results  Airway Mallampati: I  TM Distance: >3 FB Neck ROM: Full    Dental  (+) Edentulous Upper   Pulmonary neg pulmonary ROS,  breath sounds clear to auscultation        Cardiovascular negative cardio ROS  Rhythm:Regular Rate:Normal     Neuro/Psych    GI/Hepatic negative GI ROS, Neg liver ROS,   Endo/Other  negative endocrine ROS  Renal/GU negative Renal ROS     Musculoskeletal Back pain   Abdominal   Peds negative pediatric ROS (+)  Hematology   Anesthesia Other Findings   Reproductive/Obstetrics                           Anesthesia Physical Anesthesia Plan  ASA: I  Anesthesia Plan: General   Post-op Pain Management:    Induction:   Airway Management Planned: Oral ETT  Additional Equipment:   Intra-op Plan:   Post-operative Plan: Extubation in OR  Informed Consent: I have reviewed the patients History and Physical, chart, labs and discussed the procedure including the risks, benefits and alternatives for the proposed anesthesia with the patient or authorized representative who has indicated his/her understanding and acceptance.   Dental advisory given  Plan Discussed with: CRNA  Anesthesia Plan Comments:         Anesthesia Quick Evaluation

## 2014-08-29 NOTE — Transfer of Care (Signed)
Immediate Anesthesia Transfer of Care Note  Patient: Samantha Maddox  Procedure(s) Performed: Procedure(s) with comments: ANTERIOR LATERAL LUMBAR FUSION 1 LEVEL (Left) - Left sided lumbar 3-4 lateral interbody fusion with allograft and instrumentation POSTERIOR LUMBAR FUSION 1 LEVEL (N/A) - Lumbar 3-4 Posterior lumbar interbody fusion with instrumentation  Patient Location: PACU  Anesthesia Type:General  Level of Consciousness: awake, alert  and patient cooperative  Airway & Oxygen Therapy: Patient Spontanous Breathing and Patient connected to nasal cannula oxygen  Post-op Assessment: Report given to RN, Post -op Vital signs reviewed and stable and Patient moving all extremities X 4  Post vital signs: Reviewed and stable  Last Vitals:  Filed Vitals:   08/29/14 0548  BP: 137/77  Pulse: 81  Temp: 36.5 C  Resp: 18    Complications: No apparent anesthesia complications

## 2014-08-29 NOTE — Op Note (Signed)
NAME:  Samantha Maddox, Samantha Maddox                     ACCOUNT NO.:  [id number]  MEDICAL RECORD NO.:  [id number]  LOCATION:  3C09C                        FACILITY:  [facility]  PHYSICIAN:  Estill BambergMark Maddox Boyack, MD      DATE OF BIRTH:  March 20, 1956  DATE OF PROCEDURE: 08/29/2014                              OPERATIVE REPORT   PREOPERATIVE DIAGNOSES: 1. Spinal stenosis, L3-4. 2. Grade 1 spondylolisthesis, L3-4. 3. Status post previous L4-5 decompression and fusion many years ago. 4. Neurogenic claudication.  POSTOPERATIVE DIAGNOSES: 1. Spinal stenosis, L3-4. 2. Grade 1 spondylolisthesis, L3-4. 3. Status post previous L4-5 decompression and fusion many years ago. 4. Neurogenic claudication.  PROCEDURE: 1. Anterior lumbar interbody fusion, L3-4, via a left-sided lateral     interbody approach. 2. Placement of anterior instrumentation, L3-4. 3. Insertion of interbody device x1 (14 x 45 mm x 18 mm NuVasive     intervertebral spacer). 4. Intraoperative use of fluoroscopy. 5. Use of morselized allograft-DBX mix. 6. Posterior spinal fusion, L3-4. 7. Placement of posterior instrumentation, L3-4.  SURGEON:  Estill BambergMark Samantha Worth, MD  ASSISTANT:  Jason CoopKayla McKenzie, PA-C  ANESTHESIA:  General endotracheal anesthesia.  COMPLICATIONS:  None.  DISPOSITION:  Stable.  ESTIMATED BLOOD LOSS:  Minimal.  INDICATIONS FOR SURGERY:  Briefly, [name] is a very pleasant 59 year old female, who did present to me with bilateral leg pain.  Of note, the patient is status post an L4-5 fusion many years ago.  The patient's bilateral leg pain was increasing over the course of the last 1 year. She did fail multiple forms of conservative care, including physical therapy and injections.  She did continue to have ongoing pain.  We therefore did discuss proceeding with the procedure reflected above. The patient did elect to proceed.  OPERATIVE DETAILS:  On Aug 29, 2014, patient was brought to surgery and general endotracheal anesthesia was  administered.  The patient was placed in the lateral decubitus position, with the left side up.  All bony prominences were meticulously padded.  The hips and knees were flexed appropriately.  The patient was secured to the bed using tape. The region of the left flank was prepped in the usual fashion.  A time- out procedure was performed.  I then made a transverse incision in line with the L3-4 intervertebral space.  The external and internal oblique musculature was dissected, as was the transversalis fascia, which was entered.  The retroperitoneal space was readily encountered.  The perineum was bluntly swept anteriorly.  The psoas muscle was readily identified.  I did advance the initial dilator through the psoas musculature to the level of the L3-4 intervertebral space.  I did use neurologic monitoring well advancing the initial dilator.  There was noted to be neurologic structures immediately posterior to the dilator. I therefore, did translate the dilator slightly anteriorly away from the neurologic structures.  I then sequentially dilated over the initial dilator with a second and third dilator.  I then placed a self-retaining retractor, which was anchored to the superior aspect of the bed.  The retractor was placed in a safe and appropriate position.  I then slightly opened the dilator.  I then used an  EMG probe to identify whether there were any neurologic structures in the immediate vicinity of the visualized field and they were not.  I then used a 15 blade knife to perform an annulotomy.  I then used a series of curettes and pituitary rongeurs to perform a thorough and complete L3-4 intervertebral diskectomy.  Once the diskectomy was complete and the endplates were appropriately prepared, I sequentially placed a series of intervertebral spacer trials.  I did feel that a 14 mm spacer would be the most appropriate fit.  The appropriate size intervertebral spacer was then packed  with DBX mix and tamped into position.  I did liberally use AP and lateral fluoroscopy to ensure appropriate positioning of the intervertebral implant.  I was very pleased with the press fit of the implant.  I then chose the 2 old NuVasive plate, which was placed over the lateral aspect of the anterior spine at L3-4.  45 mm screws were placed through the plate, at L3 and at L4.  The screws were then locked to the plate.  The plate was then locked into the appropriate configuration.  The wound was then copiously irrigated.  The dilator was then removed.  I then explored the wound for any undue bleeding and there was none encountered.  Of note, there was no sustained abnormal EMG activity noted throughout the entirety of the surgery.  The fascia was then closed using #1 Vicryl.  The skin was then closed in layers using 2-0 Vicryl followed by 4-0 Monocryl.  I then turned my attention towards the posterolateral fusion aspect of the procedure.  I then made a paramedian incision over the lateral aspect of the L3-4 pedicles on the left.  The posterolateral gutter was identified and decorticated. The L3-4 facet joint was also decorticated.  DBX mix was then packed into the posterolateral gutter to aid in the success of the fusion.  I then cannulated the L3 and L4 pedicles in the usual manner using AP and lateral fluoroscopy.  I then used a 6 mm tap, and 7 x 40 mm screws were advanced across the L4 and L3 pedicles on the left side.  I was very pleased with the press-fit on the pedicle screws.  I did use triggered EMG to test each of the taps, and there was no tap that tested below 30 milliamps.  A 45 mm rod was then secured into the edge of the screws. Caps were placed and a final locking procedure was performed.  I was very pleased with the final AP and lateral fluoroscopic images.  The wound was then copiously irrigated.  The wound was then closed in layers using #1 Vicryl followed by 2-0 Vicryl,  followed by 3-0 Monocryl. Benzoin and Steri-Strips were applied followed by sterile dressing.  All instrument counts were correct at the termination of the procedure.  Of note, Jason CoopKayla McKenzie was my assistant throughout surgery, and did aid in retraction, suctioning and closure.     Estill BambergMark Niyla Marone, MD     MD/MEDQ  D:  08/29/2014  T:  08/29/2014  Job:  [id number]  cc:   Deatra JamesVyvyan Sun, M.D.

## 2014-08-30 ENCOUNTER — Encounter (HOSPITAL_COMMUNITY): Payer: Self-pay | Admitting: *Deleted

## 2014-08-30 MED FILL — Thrombin For Soln 20000 Unit: CUTANEOUS | Qty: 1 | Status: AC

## 2014-08-30 NOTE — Progress Notes (Signed)
Occupational Therapy Evaluation Patient Details Name: Samantha Maddox MRN: [id number] DOB: April 10, 1956 Today's Date: 08/30/2014    History of Present Illness 59 y/o [place] female s/p L3-4 ALIF/PLIF.   Clinical Impression   PTA, pt independent with ADL and mobility. Pt making good progress, however states that her husand is leaving in 2 weeks for [place] and will be gone for 3 months. Pt became very tearful. Feel pt will benefit from home health OT to further assess IADL tasks and home set up to facilitate safe recovery and maximal level of independence. Pt may benefit from a social work visit to assess community support for the period of time that the husband will be away. Will follow up with educating pt/husbadn further to facilitate safe D/C home and address established goals.     Follow Up Recommendations  Home health OT;Supervision - Intermittent    Equipment Recommendations  3 in 1 bedside comode    Recommendations for Other Services       Precautions / Restrictions Precautions Precautions: Fall;Back Precaution Booklet Issued: Yes (comment) Required Braces or Orthoses: Spinal Brace Spinal Brace: Thoracolumbosacral orthotic Restrictions Weight Bearing Restrictions: No      Mobility Bed Mobility Overal bed mobility: Needs Assistance Bed Mobility: Sidelying to Sit   Sidelying to sit: Supervision (vc for back precautions)          Transfers Overall transfer level: Needs assistance   Transfers: Sit to/from Stand Sit to Stand: Min guard         General transfer comment: due to c/o dizziness    Balance Overall balance assessment: Needs assistance   Sitting balance-Leahy Scale: Good       Standing balance-Leahy Scale: Fair Standing balance comment: general weakness affecting stability                            ADL Overall ADL's : Needs assistance/impaired     Grooming: Supervision/safety;Standing   Upper Body Bathing: Supervision/  safety;Set up;Sitting   Lower Body Bathing: Minimal assistance;Sit to/from stand   Upper Body Dressing : Moderate assistance;Sitting (donning TLSO)   Lower Body Dressing: Minimal assistance;Sit to/from stand   Toilet Transfer: Minimal assistance;Ambulation;Regular Social workerToilet   Toileting- Clothing Manipulation and Hygiene: Min guard;Sit to/from stand Toileting - Clothing Manipulation Details (indicate cue type and reason): vc on following back precautions for toileitng     Functional mobility during ADLs: Minimal assistance (due to c/o dizziness) General ADL Comments: Began education regarding compensatory techniques for ADL. Given handout. Pt apparently understands written language better than spoken language. Pt states her husband will only be in the states for 2 weeks, then will be gone for 3 months  Pt will benefit from reacher - needs demonstration.                     Pertinent Vitals/Pain Pain Assessment: 0-10 Pain Score: 6  Faces Pain Scale: Hurts little more Pain Location: back Pain Descriptors / Indicators: Aching Pain Intervention(s): Limited activity within patient's tolerance;Monitored during session     Hand Dominance Right   Extremity/Trunk Assessment Upper Extremity Assessment Upper Extremity Assessment: Overall WFL for tasks assessed   Lower Extremity Assessment Lower Extremity Assessment: Defer to PT evaluation   Cervical / Trunk Assessment Cervical / Trunk Assessment: Normal   Communication Communication Communication: Prefers language other than [place];No difficulties   Cognition Arousal/Alertness: Awake/alert Behavior During Therapy: Anxious (tearful) Overall Cognitive Status: Within Functional Limits for tasks  assessed                     General Comments       Exercises       Shoulder Instructions      Home Living Family/patient expects to be discharged to:: Private residence Living Arrangements: Spouse/significant  other Available Help at Discharge: Family;Available 24 hours/day (only for 2 weeks) Type of Home: House Home Access: Stairs to enter Entergy CorporationEntrance Stairs-Number of Steps: 5 Entrance Stairs-Rails: Right Home Layout: Multi-level Alternate Level Stairs-Number of Steps: 7 going one direction, 5 going the other both with rails       Bathroom Toilet: Standard Bathroom Accessibility: Yes How Accessible: Accessible via walker Home Equipment: None          Prior Functioning/Environment Level of Independence: Independent             OT Diagnosis: Generalized weakness;Acute pain   OT Problem List: Decreased strength;Decreased activity tolerance;Decreased knowledge of use of DME or AE;Decreased knowledge of precautions;Pain   OT Treatment/Interventions: Self-care/ADL training;DME and/or AE instruction;Therapeutic activities;Patient/family education    OT Goals(Current goals can be found in the care plan section) Acute Rehab OT Goals Patient Stated Goal: to be able to take care of herself OT Goal Formulation: With patient Time For Goal Achievement: 09/13/14 Potential to Achieve Goals: Good ADL Goals Pt Will Perform Lower Body Bathing: with supervision;with set-up;sit to/from stand;with caregiver independent in assisting Pt Will Perform Lower Body Dressing: with set-up;with supervision;with caregiver independent in assisting;sit to/from stand Pt Will Transfer to Toilet: ambulating;with supervision Pt Will Perform Toileting - Clothing Manipulation and hygiene: with supervision;sitting/lateral leans;sit to/from stand Additional ADL Goal #1: Pt/husband will verbalize understanding of 3/3 back precautions  OT Frequency: Min 2X/week   Barriers to D/C: Decreased caregiver support          Co-evaluation              End of Session Equipment Utilized During Treatment: Back brace Nurse Communication: Mobility status (D/C needs)  Activity Tolerance: Patient tolerated treatment  well Patient left: Other (comment) (with PT)   Time: 0940-1003 OT Time Calculation (min): 23 min Charges:  OT General Charges $OT Visit: 1 Procedure OT Evaluation $Initial OT Evaluation Tier I: 1 Procedure OT Treatments $Self Care/Home Management : 8-22 mins G-Codes:    Mercades Bajaj,HILLARY 08/30/2014, 11:24 AM   Luisa DagoHilary Madden Garron, OTR/L  [phone] 08/30/2014

## 2014-08-30 NOTE — Evaluation (Signed)
Physical Therapy Evaluation Patient Details Name: Samantha Maddox MRN: [id number] DOB: 1955/12/12 Today's Date: 08/30/2014   History of Present Illness  59 y/o [place] female s/p L3-4 ALIF/PLIF.  Clinical Impression  Patient presents with decreased mobility due to deficits listed in PT problem list.  She will benefit from continued skilled PT prior to d/c home to educate spouse to ensure carry over of precaution and brace education.  Recommend HHPT follow up especially since she'll be alone in couple of weeks.    Follow Up Recommendations Home health PT    Equipment Recommendations  3in1 (PT)    Recommendations for Other Services       Precautions / Restrictions Precautions Precautions: Fall;Back Precaution Booklet Issued: Yes (comment) Required Braces or Orthoses: Spinal Brace Spinal Brace: Thoracolumbosacral orthotic      Mobility  Bed Mobility                  Transfers Overall transfer level: Needs assistance   Transfers: Sit to/from Stand Sit to Stand: Supervision         General transfer comment: to sit on recliner, cues for scooting and hand placement  Ambulation/Gait Ambulation/Gait assistance: Supervision;Min guard Ambulation Distance (Feet): 250 Feet Assistive device: None Gait Pattern/deviations: Step-through pattern;Decreased stride length;Shuffle;Narrow base of support        Stairs Stairs: Yes Stairs assistance: Min guard Stair Management: One rail Left;Step to pattern;Forwards Number of Stairs: 10 General stair comments: cues for sequence; assist for safety  Wheelchair Mobility    Modified Rankin (Stroke Patients Only)       Balance Overall balance assessment: Needs assistance           Standing balance-Leahy Scale: Fair Standing balance comment: general weakness affecting stability                             Pertinent Vitals/Pain Pain Assessment: Faces Faces Pain Scale: Hurts little more Pain Location:  in back Pain Intervention(s): Monitored during session    Home Living Family/patient expects to be discharged to:: Private residence Living Arrangements: Spouse/significant other Available Help at Discharge: Family;Available 24 hours/day (only for 2 weeks) Type of Home: House Home Access: Stairs to enter Entrance Stairs-Rails: Right Entrance Stairs-Number of Steps: 5 Home Layout: Multi-level Home Equipment: None      Prior Function Level of Independence: Independent               Hand Dominance        Extremity/Trunk Assessment               Lower Extremity Assessment: Generalized weakness         Communication   Communication: Prefers language other than English;No difficulties  Cognition Arousal/Alertness: Awake/alert Behavior During Therapy: Anxious (tearful) Overall Cognitive Status: Within Functional Limits for tasks assessed                      General Comments General comments (skin integrity, edema, etc.): pt tearful; reports spouse leaving for a month after helping her at home couple of weeks; states has two daughters working in [place].    Exercises        Assessment/Plan    PT Assessment Patient needs continued PT services  PT Diagnosis Difficulty walking;Generalized weakness   PT Problem List Decreased knowledge of use of DME;Decreased strength;Decreased knowledge of precautions  PT Treatment Interventions DME instruction;Therapeutic exercise;Gait training;Balance training;Stair training;Functional mobility training;Therapeutic activities;Patient/family education  PT Goals (Current goals can be found in the Care Plan section) Acute Rehab PT Goals Patient Stated Goal: To be independent PT Goal Formulation: With patient Time For Goal Achievement: 09/03/14 Potential to Achieve Goals: Good    Frequency Min 5X/week   Barriers to discharge        Co-evaluation               End of Session Equipment Utilized During  Treatment: Back brace Activity Tolerance: Patient tolerated treatment well;Patient limited by pain Patient left: in chair;with call bell/phone within reach           Time: 1004-1020 PT Time Calculation (min) (ACUTE ONLY): 16 min   Charges:   PT Evaluation $Initial PT Evaluation Tier I: 1 Procedure     PT G Codes:        Samantha Maddox,Samantha Maddox 08/30/2014, 10:55 AM  Samantha Maddox, PT [phone] 08/30/2014

## 2014-08-30 NOTE — Progress Notes (Signed)
    Patient doing well Patient denies radicular leg pain + left thigh discomfort + expected LBP   Physical Exam: Filed Vitals:   08/30/14 0417  BP: 151/81  Pulse: 78  Temp: 98.6 F (37 C)  Resp: 18    Dressing in place NVI  POD #1 s/p L3/4 A/P fusion, doing well  - up with PT/OT, encourage ambulation - Percocet for pain, Valium for muscle spasms - likely d/c home tomorrow, depending on progress with PT/OT

## 2014-08-30 NOTE — Progress Notes (Signed)
Occupational Therapy Treatment Patient Details Name: Samantha Maddox MRN: [id number] DOB: March 19, 1956 Today's Date: 08/30/2014    History of present illness 59 y/o [place] female s/p L3-4 ALIF/PLIF.   OT comments  Completed education with pt's husband regarding back precautions and compensatory techniques for ADL and mobility and DME/AE recommendations. Pt ready to D/C home when medically stable. All further therapy needs to be addressed by HHOT.   Follow Up Recommendations  Home health OT;Supervision - Intermittent    Equipment Recommendations  3 in 1 bedside comode    Recommendations for Other Services      Precautions / Restrictions Precautions Precautions: Fall;Back Precaution Booklet Issued: Yes (comment) Required Braces or Orthoses: Spinal Brace Spinal Brace: Thoracolumbosacral orthotic Restrictions Weight Bearing Restrictions: No       Mobility Bed Mobility Overal bed mobility: Needs Assistance Bed Mobility: Sidelying to Sit   Sidelying to sit: Supervision       General bed mobility comments: Husband attempting to pull up pt. Educated husband on importance on letting pt control her movement patterns and speed and to try to only give her vc  Transfers Overall transfer level: Needs assistance   Transfers: Sit to/from BJ'sStand;Stand Pivot Transfers Sit to Stand: Supervision Stand pivot transfers: Supervision       General transfer comment: due to c/o dizziness    Balance Overall balance assessment: Needs assistance   Sitting balance-Leahy Scale: Good       Standing balance-Leahy Scale: Fair Standing balance comment: general weakness affecting stability                   ADL Overall ADL's : Needs assistance/impaired     Grooming: Supervision/safety;Standing   Upper Body Bathing: Supervision/ safety;Set up;Sitting   Lower Body Bathing: Minimal assistance;Sit to/from stand   Upper Body Dressing : Moderate assistance;Sitting (donning TLSO)    Lower Body Dressing: Minimal assistance;Sit to/from stand   Toilet Transfer: Minimal assistance;Ambulation;Regular Social workerToilet   Toileting- Clothing Manipulation and Hygiene: Min guard;Sit to/from stand Toileting - Clothing Manipulation Details (indicate cue type and reason): vc on following back precautions for toileitng     Functional mobility during ADLs: Minimal assistance (due to c/o dizziness) General ADL Comments: Completed education with husband regarding donning/doffing TLSO and compensatory techniques/precautions for ADL. Pt/husband verbalized understanding. Husband states that  he plans to hire someone to help his wife while he is away. Husband asking about tub transfers. Discussed options with husband, in addition to recommending pt use 3 in1 in tub when showering. Pt/husband verbalized understanding. Stressed to [facility] these home questions and any set up questions and to establish a routine with his wife prior to his leaving. . Recommended AE for toileting if needed. Recommended having reacher.                                      Cognition   Behavior During Therapy: WFL for tasks assessed/performed Overall Cognitive Status: Within Functional Limits for tasks assessed                       Extremity/Trunk Assessment  Upper Extremity Assessment Upper Extremity Assessment: Overall WFL for tasks assessed   Lower Extremity Assessment Lower Extremity Assessment: Defer to PT evaluation   Cervical / Trunk Assessment Cervical / Trunk Assessment: Normal     Pertinent Vitals/ Pain  Pain Assessment: Faces Pain Score: 6  Faces Pain Scale: Hurts little more Pain Location: back Pain Descriptors / Indicators: Grimacing Pain Intervention(s): Limited activity within patient's tolerance;Repositioned (encouraged use of ice after pt back to bed)  Home Living Family/patient expects to be discharged to:: Private residence Living  Arrangements: Spouse/significant other Available Help at Discharge: Family;Available 24 hours/day (only for 2 weeks) Type of Home: House Home Access: Stairs to enter Enter[employer] CorporationEntrance Stairs-Number of Steps: 5 Entrance Stairs-Rails: Right Home Layout: Multi-level Alternate Level Stairs-Number of Steps: 7 going one direction, 5 going the other both with rails       Bathroom Toilet: Standard Bathroom Accessibility: Yes How Accessible: Accessible via walker Home Equipment: None          Prior Functioning/Environment Level of Independence: Independent            Frequency Min 2X/week     Progress Toward Goals  OT Goals(current goals can now be found in the care plan section)  Progress towards OT goals: Progressing toward goals  Acute Rehab OT Goals Patient Stated Goal: to be able to take care of herself OT Goal Formulation: With patient Time For Goal Achievement: 09/13/14 Potential to Achieve Goals: Good ADL Goals Pt Will Perform Lower Body Bathing: with supervision;with set-up;sit to/from stand;with caregiver independent in assisting Pt Will Perform Lower Body Dressing: with set-up;with supervision;with caregiver independent in assisting;sit to/from stand Pt Will Transfer to Toilet: ambulating;with supervision Pt Will Perform Toileting - Clothing Manipulation and hygiene: with supervision;sitting/lateral leans;sit to/from stand Additional ADL Goal #1: Pt/husband will verbalize understanding of 3/3 back precautions Additional ADL Goal #2: pt/husband will be independent with donning/doffing TLSO>  Plan Discharge plan remains appropriate    Co-evaluation                 End of Session Equipment Utilized During Treatment: Back brace   Activity Tolerance Patient tolerated treatment well   Patient Left in chair;with call bell/phone within reach;with family/visitor present   Nurse Communication Mobility status        Time: [id number] OT Time Calculation (min): 22  min  Charges: OT General Charges  OT Treatments $Self Care/Home Management : 8-22 mins  Nielle Duford,HILLARY 08/30/2014, 2:11 PM   [facility] Alan Riles, OTR/L  [phone] 08/30/2014

## 2014-08-31 NOTE — Progress Notes (Addendum)
Patient alert and oriented, mae's well, voiding adequate amount of urine, swallowing without difficulty, no c/o pain. Patient discharged home with family. Script and discharged instructions given to patient. Patient's spouse speaks english and stated understanding of d/c instructions given and has an appointment with MD.

## 2014-08-31 NOTE — Progress Notes (Signed)
PATIENT ID: Samantha Maddox  MRN: [id number]  DOB/AGE:  [birth year] / 59 y.o.  2 Days Post-Op Procedure(s) (LRB): ANTERIOR LATERAL LUMBAR FUSION 1 LEVEL (Left) POSTERIOR LUMBAR FUSION 1 LEVEL (N/A)    PROGRESS NOTE Subjective:   Patient is alert, oriented, no Nausea, no Vomiting, yes passing gas, no Bowel Movement. Taking PO well. Denies SOB, Chest or Calf Pain. Using Incentive Spirometer, PAS in place. Ambulate WBAT with pt up with therapy and practicing steps, Patient reports pain as mild,     Objective: Vital signs in last 24 hours: Temp:  [98.3 F (36.8 C)-99.1 F (37.3 C)] 98.5 F (36.9 C) (05/07 0759) Pulse Rate:  [84-93] 84 (05/07 0759) Resp:  [16-18] 18 (05/07 0759) BP: (106-112)/(63-76) 106/70 mmHg (05/07 0759) SpO2:  [99 %-100 %] 100 % (05/07 0759)    Intake/Output from previous day: I/O last 3 completed shifts: In: 600 [P.O.:600] Out: 450 [Urine:450]   Intake/Output this shift:     LABORATORY DATA: No results for input(s): WBC, HGB, HCT, PLT, NA, K, CL, CO2, BUN, CREATININE, GLUCOSE, GLUCAP, INR, CALCIUM in the last 72 hours.  Invalid input(s): PT, 2  Examination: Neurologically intact Neurovascular intact Sensation intact distally Dorsiflexion/Plantar flexion intact}  Assessment:   2 Days Post-Op Procedure(s) (LRB): ANTERIOR LATERAL LUMBAR FUSION 1 LEVEL (Left) POSTERIOR LUMBAR FUSION 1 LEVEL (N/A) ADDITIONAL DIAGNOSIS:   Plan:  Weight Bearing as Tolerated (WBAT)  DVT Prophylaxis:  None  DISCHARGE PLAN: Home  DISCHARGE NEEDS: back brace when ambulating and out of bed.     Kristopher Delk R 08/31/2014, 8:42 AM

## 2014-08-31 NOTE — Progress Notes (Signed)
Physical Therapy Treatment Patient Details Name: Samantha Maddox MRN: [id number] DOB: 1955-09-10 Today's Date: 08/31/2014    History of Present Illness 59 y/o [place] female s/p L3-4 ALIF/PLIF.    PT Comments    Patient has achieved all PT goals.  Able to ambulate without assistive device and negotiate stairs.  Good understanding of back precautions.  Ready for d/c from PT perspective.  Follow Up Recommendations  Home health PT     Equipment Recommendations  3in1 (PT)    Recommendations for Other Services       Precautions / Restrictions Precautions Precautions: Fall;Back Precaution Comments: Reviewed precautions with patient, husband, and daughter Required Braces or Orthoses: Spinal Brace Spinal Brace: Thoracolumbosacral orthotic (Husband able to don/doff for patient with verbal cues) Restrictions Weight Bearing Restrictions: No    Mobility  Bed Mobility Overal bed mobility: Needs Assistance Bed Mobility: Rolling;Sidelying to Sit Rolling: Supervision Sidelying to sit: Supervision       General bed mobility comments: Patient using correct technique.  Assist for safety only  Transfers Overall transfer level: Needs assistance Equipment used: None Transfers: Sit to/from Stand Sit to Stand: Supervision         General transfer comment: Supervision - from bed and toilet.  Husband provided verbal cues to avoid bending.  Ambulation/Gait Ambulation/Gait assistance: Supervision Ambulation Distance (Feet): 350 Feet Assistive device: None Gait Pattern/deviations: Step-through pattern;Decreased stride length Gait velocity: Decreased Gait velocity interpretation: Below normal speed for age/gender General Gait Details: Patient with slow, guarded gait.  Good balance with gait.   Stairs Stairs: Yes Stairs assistance: Min guard Stair Management: One rail Right;Step to pattern;Forwards Number of Stairs: 7 General stair comments: Patient using correct sequence.   Encouraged patient to move slowly on steps.  Educated husband on correct guarding technique.  Wheelchair Mobility    Modified Rankin (Stroke Patients Only)       Balance                                    Cognition Arousal/Alertness: Awake/alert Behavior During Therapy: WFL for tasks assessed/performed Overall Cognitive Status: Within Functional Limits for tasks assessed                      Exercises      General Comments        Pertinent Vitals/Pain Pain Assessment: 0-10 Pain Score: 5  Pain Location: back Pain Descriptors / Indicators: Aching Pain Intervention(s): Monitored during session;Repositioned    Home Living                      Prior Function            PT Goals (current goals can now be found in the care plan section) Acute Rehab PT Goals Patient Stated Goal: To go home today Progress towards PT goals: Goals met/education completed, patient discharged from PT    Frequency  Min 5X/week    PT Plan Current plan remains appropriate    Co-evaluation             End of Session Equipment Utilized During Treatment: Back brace Activity Tolerance: Patient tolerated treatment well;Patient limited by pain Patient left: in bed;with call bell/phone within reach;with family/visitor present (sitting EOB)     Time: 5638-9373 PT Time Calculation (min) (ACUTE ONLY): 23 min  Charges:  $Gait Training: 23-37 mins  G CodesDespina Pole 08/31/2014, 9:38 AM Carita Pian. Sanjuana Kava, [place] Pager [phone]

## 2014-08-31 NOTE — Discharge Summary (Signed)
Patient ID: Samantha PollLien Thi Maddox MRN: [id number] DOB/AGE: 1955/08/11 59 y.o.  Admit date: 08/29/2014 Discharge date: 08/31/2014  Admission Diagnoses:  Active Problems:   Radiculopathy   Discharge Diagnoses:  Same  Past Medical History  Diagnosis Date  . Post-operative nausea and vomiting     Surgeries: Procedure(s): ANTERIOR LATERAL LUMBAR FUSION 1 LEVEL POSTERIOR LUMBAR FUSION 1 LEVEL on 08/29/2014   Consultants:    Discharged Condition: Improved  Hospital Course: Samantha Maddox is an 59 y.o. female who was admitted 08/29/2014 for operative treatment of<principal problem not specified>. Patient has severe unremitting pain that affects sleep, daily activities, and work/hobbies. After pre-op clearance the patient was taken to the operating room on 08/29/2014 and underwent  Procedure(s): ANTERIOR LATERAL LUMBAR FUSION 1 LEVEL POSTERIOR LUMBAR FUSION 1 LEVEL.    Patient was given perioperative antibiotics: Anti-infectives    Start     Dose/Rate Route Frequency Ordered Stop   08/29/14 1600  ceFAZolin (ANCEF) IVPB 1 g/50 mL premix     1 g 100 mL/hr over 30 Minutes Intravenous Every 8 hours 08/29/14 1515 08/29/14 2334   08/29/14 0700  ceFAZolin (ANCEF) IVPB 2 g/50 mL premix     2 g 100 mL/hr over 30 Minutes Intravenous To Surgery 08/28/14 1301 08/29/14 0745       Patient was given sequential compression devices, early ambulation, and chemoprophylaxis to prevent DVT.  Patient benefited maximally from hospital stay and there were no complications.    Recent vital signs: Patient Vitals for the past 24 hrs:  BP Temp Temp src Pulse Resp SpO2  08/31/14 0759 106/70 mmHg 98.5 F (36.9 C) - 84 18 100 %  08/31/14 0418 109/63 mmHg 98.8 F (37.1 C) - 84 16 99 %  08/31/14 0015 109/70 mmHg 99.1 F (37.3 C) - 86 16 100 %  08/30/14 2015 112/68 mmHg 98.5 F (36.9 C) - 88 18 100 %  08/30/14 1544 112/76 mmHg 98.9 F (37.2 C) Oral 93 18 100 %  08/30/14 1230 108/65 mmHg 98.3 F (36.8 C) Oral 88 18 100 %      Recent laboratory studies: No results for input(s): WBC, HGB, HCT, PLT, NA, K, CL, CO2, BUN, CREATININE, GLUCOSE, INR, CALCIUM in the last 72 hours.  Invalid input(s): PT, 2   Discharge Medications:     Medication List    TAKE these medications        traZODone 50 MG tablet  Commonly known as:  DESYREL  Take 50 mg by mouth at bedtime.        Diagnostic Studies: Dg Chest 2 View  08/23/2014   CLINICAL DATA:  Preop chest radiograph.  EXAM: CHEST  2 VIEW  COMPARISON:  08/14/2014  FINDINGS: The heart size and mediastinal contours are within normal limits. Both lungs are clear. The visualized skeletal structures are unremarkable.  IMPRESSION: No active cardiopulmonary disease.   Electronically Signed   By: Signa Kellaylor  Stroud M.D.   On: 08/23/2014 15:30   Dg Chest 2 View  08/14/2014   CLINICAL DATA:  Chest pain.  Shortness breath.  EXAM: CHEST  2 VIEW  COMPARISON:  None.  FINDINGS: Mediastinum and hilar structures normal. Minimal lingular infiltrate cannot be excluded. Lungs are otherwise clear of acute infiltrates. Prominent vessels on end noted . No pleural effusion or pneumothorax. No acute bony abnormality .  IMPRESSION: Minimal lingular infiltrate cannot be excluded. Exam is otherwise unremarkable.   Electronically Signed   By: Maisie Fushomas  Register   On: 08/14/2014 16:09  Dg Lumbar Spine 2-3 Views  08/29/2014   CLINICAL DATA:  Operative imaging. L3-4 XLIF with lateral plate and percutaneous screws for bilateral leg pain.  EXAM: DG C-ARM GT 120 MIN; LUMBAR SPINE - 2-3 VIEW  FLUOROSCOPY TIME:  Radiation Exposure Index (as provided by the fluoroscopic device):  If the device does not provide the exposure index:  Fluoroscopy Time (in minutes and seconds):  2 minutes and 18 seconds  Number of Acquired Images:  2  COMPARISON:  Lumbar CT, 08/16/2014  FINDINGS: A posterior fusion has been performed at L3 and L4, with a partly imaged posterior fusion evident at L5-S1. There is also screws and fixation  plate which have been inserted from the lateral aspect of the spine for additional fusion of L3-L4. The orthopedic hardware appears well seated and aligned. Anterolisthesis of L4 on L5 and loss of disc height at this level is stable from the prior CT.  IMPRESSION: Lumbar spine fusion operative imaging as described. No evidence of an operative complication.   Electronically Signed   By: Amie Portland M.D.   On: 08/29/2014 11:27   Ct Lumbar Spine Wo Contrast  08/16/2014   CLINICAL DATA:  59 year old female with lumbar back pain. Preoperative exam. Previous surgery in 2006. Initial encounter.  EXAM: CT LUMBAR SPINE WITHOUT CONTRAST  TECHNIQUE: Multidetector CT imaging of the lumbar spine was performed without intravenous contrast administration. Multiplanar CT image reconstructions were also generated.  COMPARISON:  [facility] lumbar MRI 08/21/2013, and earlier  FINDINGS: Same numbering system as in 2015 designating chronic spondylolisthesis with interbody fusion as L4-L5. Stable lumbar vertebral height and alignment. No acute osseous abnormality identified. Visible sacrum and SI joints intact.  Mild Aortoiliac calcified atherosclerosis noted. Grossly negative visualized noncontrast abdominal viscera. Negative posterior paraspinal soft tissues.  T12-L1:  Stable mild facet hypertrophy.  No stenosis.  L1-L2: Minimal disc bulge. Trace vacuum disc anteriorly. Stable mild facet hypertrophy. No significant stenosis.  L2-L3: Stable mild disc bulge. Stable moderate to severe facet hypertrophy and ligament flavum hypertrophy. Borderline to mild spinal stenosis.  L3-L4: Grade 1 anterolisthesis appears stable. No pars fracture. Circumferential disc/ pseudo disc. Severe facet hypertrophy. Moderate to severe ligament flavum hypertrophy. Subsequent severe spinal stenosis appears stable with up to severe lateral recess stenosis. Mild L3 foraminal stenosis is stable.  L4-L5: Sequelae of decompression and  fusion. Intact trans laminar screws without loosening. Solid interbody and posterior element arthrodesis. Stable spondylolisthesis. Stable mild left greater than right L4 foraminal stenosis.  L5-S1: Mostly sacralized L5 level. Stable mild facet hypertrophy with no stenosis.  IMPRESSION: 1. Same numbering system as on the 2015 MRI. Solid fusion at L4-L5 with stable mild foraminal stenosis. No hardware loosening. 2. 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 Disc/pseudo disc and severe facet arthropathy. Stable mild L3 foraminal stenosis. 3. Borderline to mild multifactorial spinal stenosis at L2-L3.   Electronically Signed   By: Odessa Fleming M.D.   On: 08/16/2014 11:49   Dg C-arm Gt 120 Min  08/29/2014   CLINICAL DATA:  Operative imaging. L3-4 XLIF with lateral plate and percutaneous screws for bilateral leg pain.  EXAM: DG C-ARM GT 120 MIN; LUMBAR SPINE - 2-3 VIEW  FLUOROSCOPY TIME:  Radiation Exposure Index (as provided by the fluoroscopic device):  If the device does not provide the exposure index:  Fluoroscopy Time (in minutes and seconds):  2 minutes and 18 seconds  Number of Acquired Images:  2  COMPARISON:  Lumbar CT,  08/16/2014  FINDINGS: A posterior fusion has been performed at L3 and L4, with a partly imaged posterior fusion evident at L5-S1. There is also screws and fixation plate which have been inserted from the lateral aspect of the spine for additional fusion of L3-L4. The orthopedic hardware appears well seated and aligned. Anterolisthesis of L4 on L5 and loss of disc height at this level is stable from the prior CT.  IMPRESSION: Lumbar spine fusion operative imaging as described. No evidence of an operative complication.   Electronically Signed   By: Amie Portlandavid  Ormond M.D.   On: 08/29/2014 11:27    Disposition:       Discharge Instructions    Call MD / Call 911    Complete by:  As directed   If you experience chest pain or shortness of breath, CALL 911 and  be transported to the hospital emergency room.  If you develope a fever above 101 F, pus (white drainage) or increased drainage or redness at the wound, or calf pain, call your surgeon's office.     Constipation Prevention    Complete by:  As directed   Drink plenty of fluids.  Prune juice may be helpful.  You may use a stool softener, such as Colace (over the counter) 100 mg twice a day.  Use MiraLax (over the counter) for constipation as needed.     Diet - low sodium heart healthy    Complete by:  As directed      Driving restrictions    Complete by:  As directed   No driving for 2 weeks     Increase activity slowly as tolerated    Complete by:  As directed            Follow-up Information    Follow up with Emilee HeroUMONSKI,MARK LEONARD, MD In 2 weeks.   Specialty:  Orthopedic Surgery   Contact information:   [address] [place] [address] [phone]        Signed: Vear ClockHILLIPS, Bryanda Mikel R 08/31/2014, 8:45 AM

## 2014-09-03 ENCOUNTER — Encounter (HOSPITAL_COMMUNITY): Payer: Self-pay | Admitting: Orthopedic Surgery

## 2014-09-12 ENCOUNTER — Encounter (HOSPITAL_COMMUNITY): Payer: Self-pay | Admitting: Orthopedic Surgery

## 2014-09-13 ENCOUNTER — Encounter (HOSPITAL_COMMUNITY): Payer: Self-pay | Admitting: Orthopedic Surgery

## 2016-01-24 IMAGING — CT CT L SPINE W/O CM
4 of 11 series · 12 of 33 positions shown, 14 images · non-contrast
Comparison: [REDACTED] lumbar MRI
08/21/2013, and earlier

CLINICAL DATA: 58-year-old female with lumbar back pain.
Preoperative exam. Previous surgery in 9999. Initial encounter.

EXAM:
CT LUMBAR SPINE WITHOUT CONTRAST
TECHNIQUE: Multidetector CT imaging of the lumbar spine was performed without
intravenous contrast administration. Multiplanar CT image
reconstructions were also generated.

[Series 4: l spine bone · axial · 0.28mm/px · z∈[-14,+48]mm · 2 of 77 slices shown, 3 images]
[im 26/77  soft-tissue]
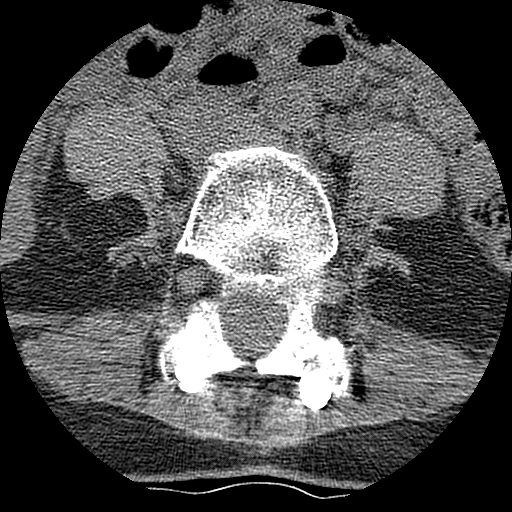
[im 26/77  bone]
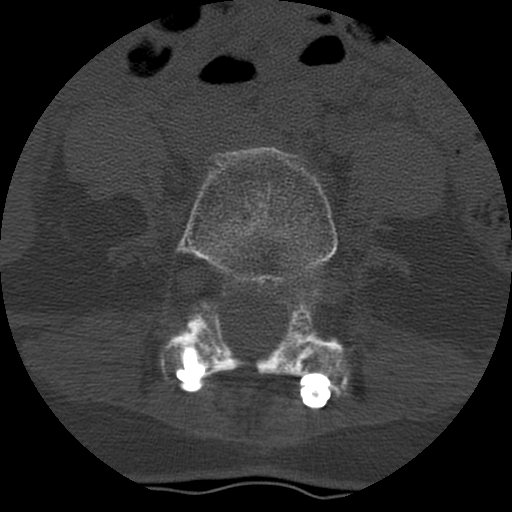
[im 51/77  bone]
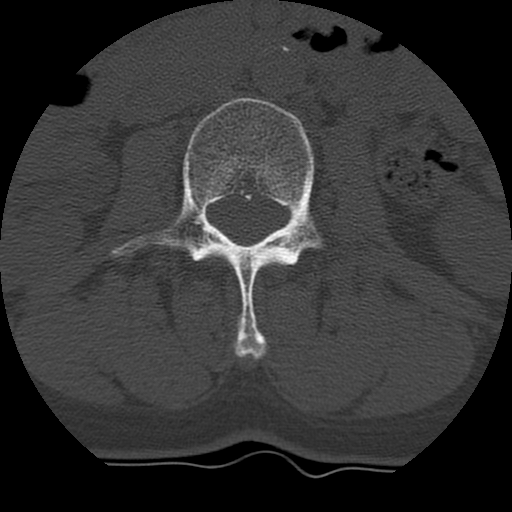

[Series 5: l spine detail · axial · 0.28mm/px · z∈[-14,+48]mm · 2 of 77 slices shown]
[im 26/77  bone]
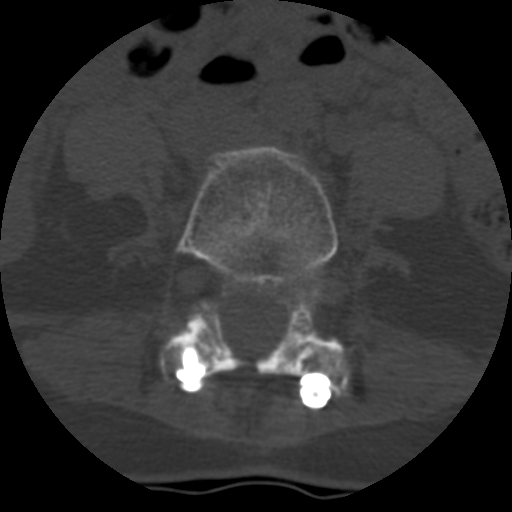
[im 51/77  bone]
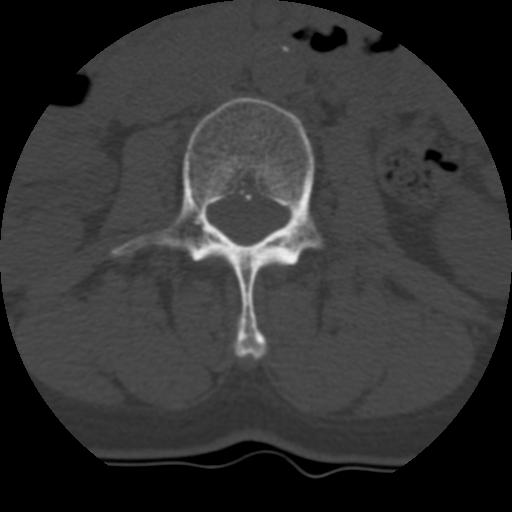

[Series 201: cor 2 · coronal · 0.38mm/px · 3 of 57 slices shown]
[im 4/57  bone]
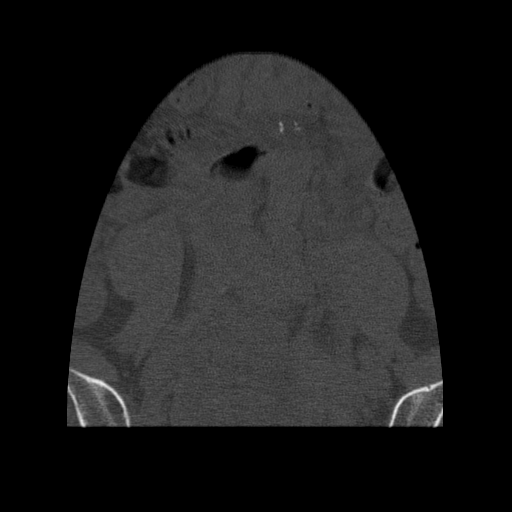
[im 28/57  bone]
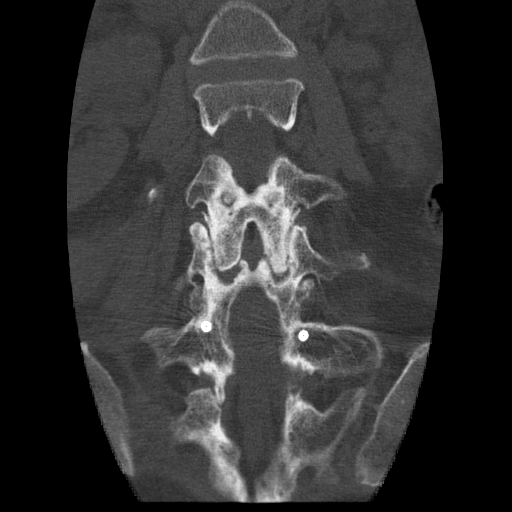
[im 53/57  bone]
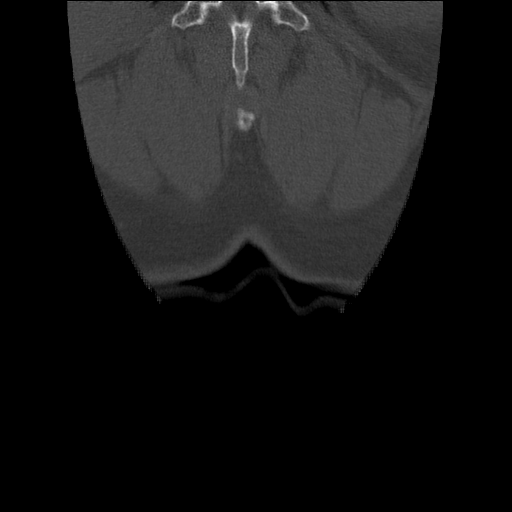

[Series 202: sag · sagittal · 0.38mm/px · 5 of 58 slices shown, 6 images]
[im 20/58  bone]
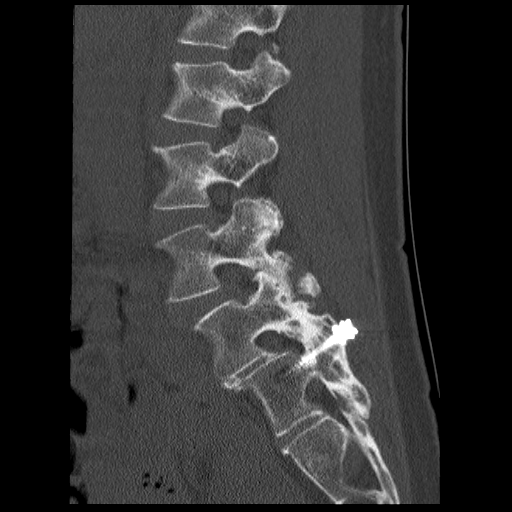
[im 24/58  bone]
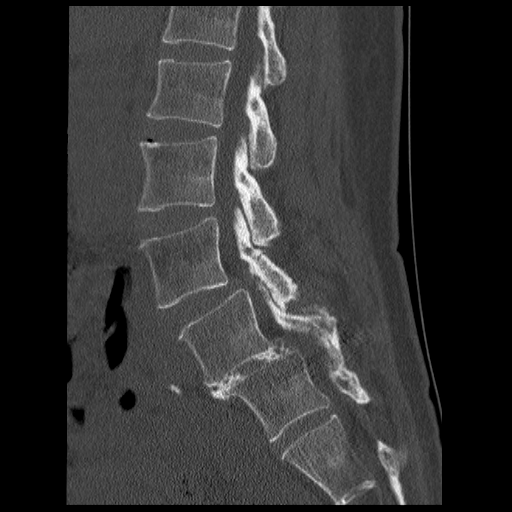
[im 29/58  soft-tissue]
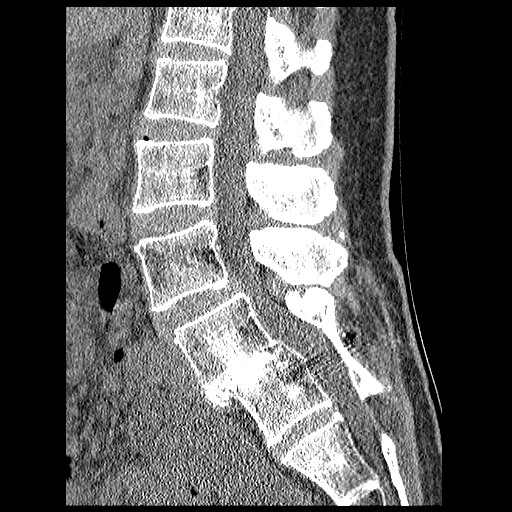
[im 29/58  bone]
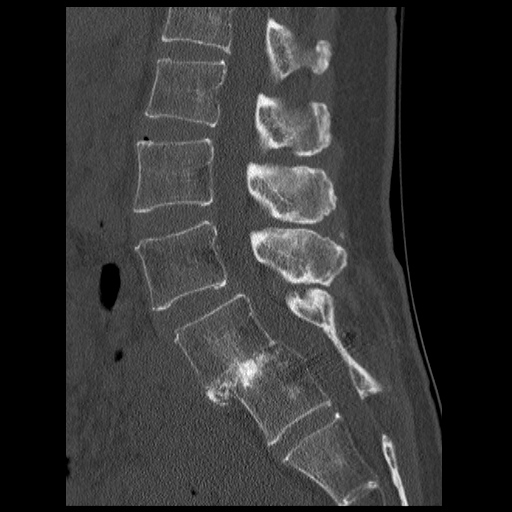
[im 34/58  bone]
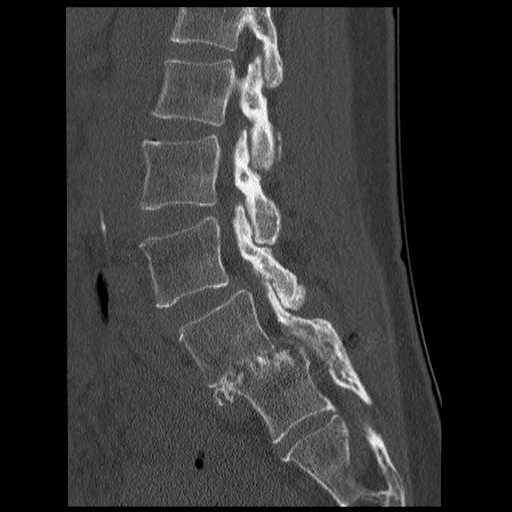
[im 39/58  bone]
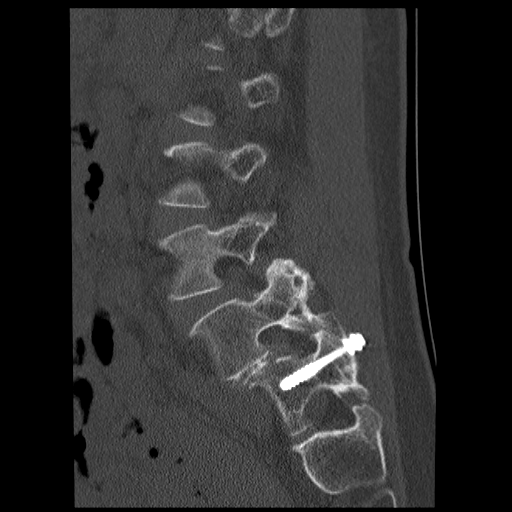

[12 of 33 positions shown; findings below may reference images not displayed]

FINDINGS: Same numbering system as in 1679 designating chronic
spondylolisthesis with interbody fusion as L4-L5. Stable lumbar
vertebral height and alignment. No acute osseous abnormality
identified. Visible sacrum and SI joints intact.

Mild Aortoiliac calcified atherosclerosis noted. Grossly negative
visualized noncontrast abdominal viscera. Negative posterior
paraspinal soft tissues.

T12-L1:  Stable mild facet hypertrophy.  No stenosis.

L1-L2: Minimal disc bulge. Trace vacuum disc anteriorly. Stable mild
facet hypertrophy. No significant stenosis.

L2-L3: Stable mild disc bulge. Stable moderate to severe facet
hypertrophy and ligament flavum hypertrophy. Borderline to mild
spinal stenosis.

L3-L4: Grade 1 anterolisthesis appears stable. No pars fracture.
Circumferential disc/ pseudo disc. Severe facet hypertrophy.
Moderate to severe ligament flavum hypertrophy. Subsequent severe
spinal stenosis appears stable with up to severe lateral recess
stenosis. Mild L3 foraminal stenosis is stable.

L4-L5: Sequelae of decompression and fusion. Intact trans laminar
screws without loosening. Solid interbody and posterior element
arthrodesis. Stable spondylolisthesis. Stable mild left greater than
right L4 foraminal stenosis.

L5-S1: Mostly sacralized L5 level. Stable mild facet hypertrophy
with no stenosis.
IMPRESSION: 1. Same numbering system as on the 1679 MRI. Solid fusion at L4-L5
with stable mild foraminal stenosis. No hardware loosening.
2. Multifactorial severe spinal stenosis at L3-L4 with stable grade
1 anterolisthesis. Disc/pseudo disc and severe facet arthropathy.
Stable mild L3 foraminal stenosis.
3. Borderline to mild multifactorial spinal stenosis at L2-L3.

## 2016-02-06 IMAGING — RF DG C-ARM GT 120 MIN
1 series · 2 of 2 positions shown · non-contrast
Comparison: Lumbar CT, 08/16/2014

CLINICAL DATA: Operative imaging. L3-4 XLIF with lateral plate and
percutaneous screws for bilateral leg pain.

EXAM:
DG C-ARM GT 120 MIN; LUMBAR SPINE - 2-3 VIEW
FLUOROSCOPY TIME:  Radiation Exposure Index (as provided by the
fluoroscopic device):
If the device does not provide the exposure index:
Fluoroscopy Time (in minutes and seconds):  2 minutes and 18 seconds
Number of Acquired Images:  2

[Series 1: run · 2 of 2 slices shown]
[im 1/2]
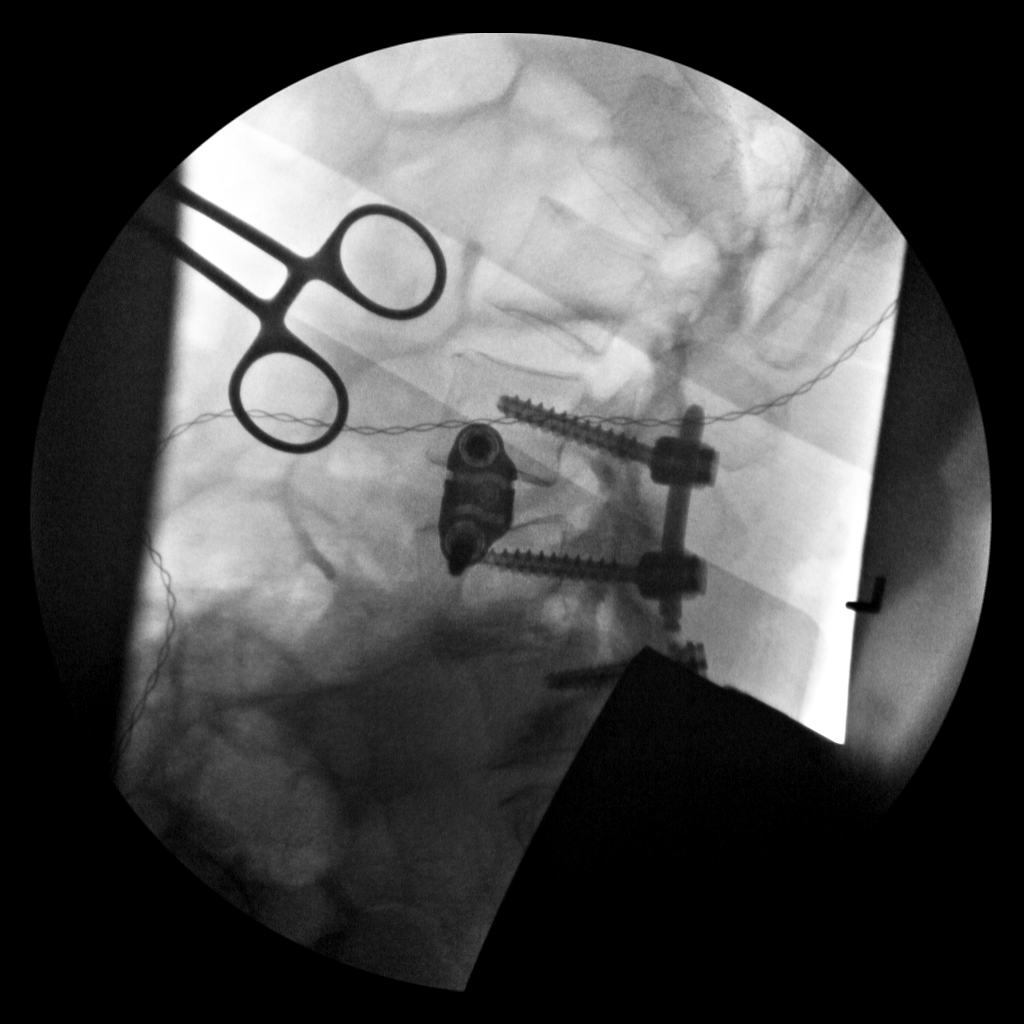
[im 2/2]
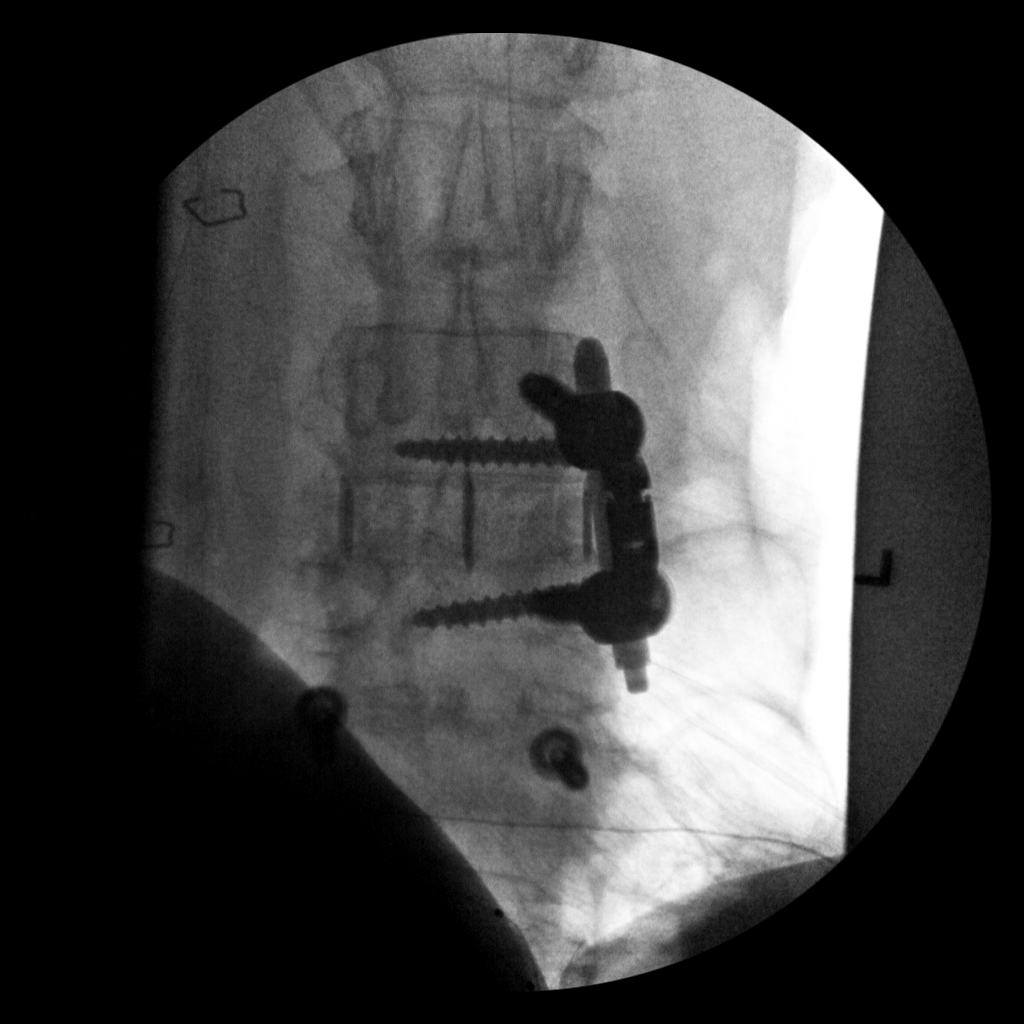

[2 of 2 positions shown; findings below may reference images not displayed]

FINDINGS: A posterior fusion has been performed at L3 and L4, with a partly
imaged posterior fusion evident at L5-S1. There is also screws and
fixation plate which have been inserted from the lateral aspect of
the spine for additional fusion of L3-L4. The orthopedic hardware
appears well seated and aligned. Anterolisthesis of L4 on L5 and
loss of disc height at this level is stable from the prior CT.
IMPRESSION: Lumbar spine fusion operative imaging as described. No evidence of
an operative complication.

## 2018-07-12 ENCOUNTER — Ambulatory Visit (INDEPENDENT_AMBULATORY_CARE_PROVIDER_SITE_OTHER): Payer: BLUE CROSS/BLUE SHIELD

## 2018-07-12 ENCOUNTER — Other Ambulatory Visit: Payer: Self-pay

## 2018-07-12 ENCOUNTER — Ambulatory Visit (INDEPENDENT_AMBULATORY_CARE_PROVIDER_SITE_OTHER): Payer: BLUE CROSS/BLUE SHIELD | Admitting: Orthopaedic Surgery

## 2018-07-12 ENCOUNTER — Encounter (INDEPENDENT_AMBULATORY_CARE_PROVIDER_SITE_OTHER): Payer: Self-pay | Admitting: Orthopaedic Surgery

## 2018-07-12 VITALS — Ht 59.0 in | Wt 106.0 lb

## 2018-07-12 DIAGNOSIS — G8929 Other chronic pain: Secondary | ICD-10-CM

## 2018-07-12 DIAGNOSIS — M25562 Pain in left knee: Secondary | ICD-10-CM | POA: Diagnosis not present

## 2018-07-12 DIAGNOSIS — M5410 Radiculopathy, site unspecified: Secondary | ICD-10-CM

## 2018-07-12 DIAGNOSIS — M1712 Unilateral primary osteoarthritis, left knee: Secondary | ICD-10-CM | POA: Diagnosis not present

## 2018-07-12 DIAGNOSIS — M170 Bilateral primary osteoarthritis of knee: Secondary | ICD-10-CM | POA: Diagnosis not present

## 2018-07-12 DIAGNOSIS — M25561 Pain in right knee: Secondary | ICD-10-CM | POA: Diagnosis not present

## 2018-07-12 MED ORDER — BUPIVACAINE HCL 0.25 % IJ SOLN
0.6600 mL | INTRAMUSCULAR | Status: AC | PRN
  Administered 2018-07-12: .66 mL via INTRA_ARTICULAR

## 2018-07-12 MED ORDER — METHYLPREDNISOLONE ACETATE 40 MG/ML IJ SUSP
13.3300 mg | INTRAMUSCULAR | Status: AC | PRN
  Administered 2018-07-12: 13.33 mg via INTRA_ARTICULAR

## 2018-07-12 MED ORDER — LIDOCAINE HCL 1 % IJ SOLN
3.0000 mL | INTRAMUSCULAR | Status: AC | PRN
  Administered 2018-07-12: 3 mL

## 2018-07-12 NOTE — Progress Notes (Signed)
Office Visit Note   Patient: Samantha Maddox           Date of Birth: February 24, 1956           MRN: [id number] Visit Date: 07/12/2018              Requested by: Deatra James, MD [id number] [place], [place] [address] PCP: Deatra James, MD   Assessment & Plan: Visit Diagnoses:  1. Chronic pain of both knees     Plan: Impression is bilateral knee chondromalacia patella.  We will reinject both knees with cortisone today.  She will take these in the next 2 days.  She will follow-up with [place] as needed.  Follow-Up Instructions: Return if symptoms worsen or fail to improve.   Orders:  Orders Placed This Encounter  Procedures  . Large Joint Inj: bilateral knee  . XR Knee Complete 4 Views Left  . XR Knee Complete 4 Views Right   No orders of the defined types were placed in this encounter.     Procedures: Large Joint Inj: bilateral knee on 07/12/2018 2:43 PM Indications: pain Details: 22 G needle, anterolateral approach Medications (Right): 0.66 mL bupivacaine 0.25 %; 3 mL lidocaine 1 %; 13.33 mg methylPREDNISolone acetate 40 MG/ML Medications (Left): 0.66 mL bupivacaine 0.25 %; 3 mL lidocaine 1 %; 13.33 mg methylPREDNISolone acetate 40 MG/ML      Clinical Data: No additional findings.   Subjective: Chief Complaint  Patient presents with  . Right Knee - Pain  . Left Knee - Pain    HPI patient is a pleasant 63 year old female who presents our clinic today with bilateral knee pain left greater than right.  History of what looks to be chondromalacia patella.  She has been seen by Dr. Cornelius Moras in the past where she has had both knees injected with cortisone.  These injections were in May 2019.  She had great relief until recently.  Her pain has returned.  She denies any mechanical symptoms.  Pain is worsened with stairs and hills.  She has no pain at rest.  No night pain.  She would like repeat bilateral cortisone injections today.  Review of Systems as detailed in HPI.   All others reviewed and are negative.   Objective: Vital Signs: Ht 4\' 11"  (1.499 m)   Wt 106 lb (48.1 kg)   BMI 21.41 kg/m   Physical Exam.  Well-developed and well-nourished female in no acute distress.  Alert and oriented x3.  Ortho Exam examination of both knees reveals trace effusions.  Range of motion 0 to 125 degrees.  Medial joint line tenderness left knee.  Marked patellofemoral crepitus both knees.  She is stable to valgus varus stress.  She is neurovascular intact distally.  Specialty Comments:  No specialty comments available.  Imaging: Xr Knee Complete 4 Views Left  Result Date: 07/12/2018 Moderate degenerative changes patellofemoral compartment.  Xr Knee Complete 4 Views Right  Result Date: 07/12/2018 Moderate degenerative changes patellofemoral compartment.    PMFS History: Patient Active Problem List   Diagnosis Date Noted  . Chronic pain of both knees 07/12/2018  . Radiculopathy 08/29/2014   Past Medical History:  Diagnosis Date  . Post-operative nausea and vomiting     History reviewed. No pertinent family history.  Past Surgical History:  Procedure Laterality Date  . ANTERIOR LAT LUMBAR FUSION Left 08/29/2014   Procedure: ANTERIOR LATERAL LUMBAR FUSION 1 LEVEL;  Surgeon: Estill Bamberg, MD;  Location: [facility] OR;  Service: Orthopedics;  Laterality: Left;  Left sided lumbar 3-4 lateral interbody fusion with allograft and instrumentation  . BACK SURGERY     03/2005   Social History   Occupational History  . Not on file  Tobacco Use  . Smoking status: Never Smoker  . Smokeless tobacco: Never Used  Substance and Sexual Activity  . Alcohol use: No  . Drug use: No  . Sexual activity: Not on file

## 2018-08-18 ENCOUNTER — Ambulatory Visit (INDEPENDENT_AMBULATORY_CARE_PROVIDER_SITE_OTHER): Payer: BLUE CROSS/BLUE SHIELD | Admitting: Orthopaedic Surgery

## 2018-08-18 ENCOUNTER — Other Ambulatory Visit (INDEPENDENT_AMBULATORY_CARE_PROVIDER_SITE_OTHER): Payer: Self-pay

## 2018-08-18 ENCOUNTER — Other Ambulatory Visit: Payer: Self-pay

## 2018-08-18 DIAGNOSIS — G8929 Other chronic pain: Secondary | ICD-10-CM

## 2018-08-18 DIAGNOSIS — M25562 Pain in left knee: Principal | ICD-10-CM

## 2018-08-18 MED ORDER — MELOXICAM 7.5 MG PO TABS: 7.5000 mg | ORAL_TABLET | Freq: Two times a day (BID) | ORAL | 2 refills | Status: AC | PRN

## 2018-08-18 NOTE — Progress Notes (Signed)
   Office Visit Note   Patient: Samantha Maddox           Date of Birth: 15-Jan-1956           MRN: [id number] Visit Date: 08/18/2018              Requested by: Deatra James, MD [phone] [address] [place], [place] [address] PCP: Deatra James, MD   Assessment & Plan: Visit Diagnoses:  1. Chronic pain of left knee     Plan: Impression is left knee medial meniscal tear possible meniscal root tear.  She received no relief from the previous cortisone injection.  At this point we will order an MRI of the left knee to evaluate for medial meniscal tear.  We have submitted the order and she will be contacted to have the MRI by [facility].  In the meantime I have given her a prescription for meloxicam which will hopefully give her some relief.  Follow-Up Instructions: Return if symptoms worsen or fail to improve.   Orders:  No orders of the defined types were placed in this encounter.  Meds ordered this encounter  Medications  . meloxicam (MOBIC) 7.5 MG tablet    Sig: Take 1 tablet (7.5 mg total) by mouth 2 (two) times daily as needed for pain.    Dispense:  60 tablet    Refill:  2      Procedures: No procedures performed   Clinical Data: No additional findings.   Subjective: Chief Complaint  Patient presents with  . Left Knee - Pain, Follow-up    Patient returns today for continued left knee pain.  She had a cortisone injection about 5 to 6 weeks ago but this is failed to give her any relief.  She hurts along the medial joint line as well as when her knee is in deep flexion.  She denies any swelling.  Denies any mechanical symptoms.   Review of Systems   Objective: Vital Signs: There were no vitals taken for this visit.  Physical Exam  Ortho Exam Left knee exam shows medial joint line tenderness.  No joint effusion.  Collaterals and cruciates are stable.  1+ patellofemoral crepitus.  No popliteal masses. Specialty Comments:  No specialty comments available.   Imaging: No results found.   PMFS History: Patient Active Problem List   Diagnosis Date Noted  . Chronic pain of both knees 07/12/2018  . Radiculopathy 08/29/2014   Past Medical History:  Diagnosis Date  . Post-operative nausea and vomiting     No family history on file.  Past Surgical History:  Procedure Laterality Date  . ANTERIOR LAT LUMBAR FUSION Left 08/29/2014   Procedure: ANTERIOR LATERAL LUMBAR FUSION 1 LEVEL;  Surgeon: Estill Bamberg, MD;  Location: [facility] OR;  Service: Orthopedics;  Laterality: Left;  Left sided lumbar 3-4 lateral interbody fusion with allograft and instrumentation  . BACK SURGERY     03/2005   Social History   Occupational History  . Not on file  Tobacco Use  . Smoking status: Never Smoker  . Smokeless tobacco: Never Used  Substance and Sexual Activity  . Alcohol use: No  . Drug use: No  . Sexual activity: Not on file

## 2018-11-27 ENCOUNTER — Encounter: Payer: Self-pay | Admitting: *Deleted
